# Patient Record
Sex: Female | Born: 1970 | Race: White | Hispanic: No | Marital: Married | Smoking: Never smoker
Health system: Southern US, Community
[De-identification: ages and names within clinical notes are randomized; demographics above are authoritative.]

## PROBLEM LIST (undated history)

## (undated) DIAGNOSIS — M1990 Unspecified osteoarthritis, unspecified site: Secondary | ICD-10-CM

## (undated) DIAGNOSIS — Z9889 Other specified postprocedural states: Secondary | ICD-10-CM

## (undated) DIAGNOSIS — F419 Anxiety disorder, unspecified: Secondary | ICD-10-CM

## (undated) DIAGNOSIS — R112 Nausea with vomiting, unspecified: Secondary | ICD-10-CM

## (undated) DIAGNOSIS — E785 Hyperlipidemia, unspecified: Secondary | ICD-10-CM

## (undated) DIAGNOSIS — R748 Abnormal levels of other serum enzymes: Secondary | ICD-10-CM

## (undated) DIAGNOSIS — K760 Fatty (change of) liver, not elsewhere classified: Secondary | ICD-10-CM

## (undated) DIAGNOSIS — H60543 Acute eczematoid otitis externa, bilateral: Secondary | ICD-10-CM

## (undated) DIAGNOSIS — G43909 Migraine, unspecified, not intractable, without status migrainosus: Secondary | ICD-10-CM

## (undated) DIAGNOSIS — M25569 Pain in unspecified knee: Secondary | ICD-10-CM

## (undated) DIAGNOSIS — J302 Other seasonal allergic rhinitis: Secondary | ICD-10-CM

## (undated) DIAGNOSIS — T4145XA Adverse effect of unspecified anesthetic, initial encounter: Secondary | ICD-10-CM

## (undated) DIAGNOSIS — R0600 Dyspnea, unspecified: Secondary | ICD-10-CM

## (undated) DIAGNOSIS — D649 Anemia, unspecified: Secondary | ICD-10-CM

## (undated) DIAGNOSIS — T8859XA Other complications of anesthesia, initial encounter: Secondary | ICD-10-CM

## (undated) HISTORY — PX: BLADDER REPAIR: SHX76

## (undated) HISTORY — PX: ABDOMINAL HYSTERECTOMY: SHX81

---
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
## 2008-04-07 ENCOUNTER — Encounter: Admission: RE | Admit: 2008-04-07 | Discharge: 2008-04-07 | Payer: Self-pay | Admitting: Neurology

## 2009-01-27 ENCOUNTER — Encounter: Admission: RE | Admit: 2009-01-27 | Discharge: 2009-01-27 | Payer: Self-pay | Admitting: Geriatric Medicine

## 2009-02-16 ENCOUNTER — Ambulatory Visit: Payer: Self-pay | Admitting: Family Medicine

## 2009-07-25 ENCOUNTER — Ambulatory Visit: Payer: Self-pay | Admitting: Obstetrics and Gynecology

## 2009-07-28 ENCOUNTER — Ambulatory Visit: Payer: Self-pay | Admitting: Obstetrics and Gynecology

## 2011-07-23 ENCOUNTER — Ambulatory Visit: Payer: Self-pay | Admitting: Family Medicine

## 2011-10-02 ENCOUNTER — Ambulatory Visit: Payer: Self-pay | Admitting: Obstetrics and Gynecology

## 2011-10-09 ENCOUNTER — Ambulatory Visit: Payer: Self-pay | Admitting: Obstetrics and Gynecology

## 2011-10-10 LAB — PATHOLOGY REPORT

## 2011-10-13 ENCOUNTER — Inpatient Hospital Stay: Payer: Self-pay | Admitting: Obstetrics and Gynecology

## 2011-10-14 ENCOUNTER — Ambulatory Visit: Payer: Self-pay | Admitting: Urology

## 2013-11-17 ENCOUNTER — Ambulatory Visit: Payer: Self-pay | Admitting: Family Medicine

## 2014-11-18 ENCOUNTER — Ambulatory Visit: Payer: Self-pay | Admitting: Family Medicine

## 2015-03-27 NOTE — Op Note (Signed)
PATIENT NAME:  Felicia Ellis, Felicia Ellis MR#:  [id number] DATE OF BIRTH:  1971/01/13  DATE OF PROCEDURE:  10/14/2011  PREOPERATIVE DIAGNOSIS: Bladder perforation.  POSTOPERATIVE DIAGNOSIS: Bladder perforation.  PROCEDURE PERFORMED: Pelvic exploration with repair of cystotomy (transvesical).  SURGEON: Darcella Cheshire, MD  ASSISTANT: Dia Crawford, MD  ANESTHESIA:  General.  ESTIMATED BLOOD LOSS: 200 mL.  COMPLICATIONS: None.  DRAINS:  1. 36 French Foley catheter to straight drainage. 2. 19 French Blake drain in the right pelvis to bulb suction.  PATHOLOGY/SPECIMENS: None.  DESCRIPTION OF PROCEDURE: The patient was brought to the operating room and placed on the operating room table in the supine position.  After general anesthesia was established, the patient was placed in a low lithotomy position and prepped and draped in the usual sterile fashion.  It should be noted that the previously placed Foley catheter was removed prior to prepping and draping the patient.  A 22 French Foley catheter was then placed sterilely per urethra and the balloon inflated with 10 mL of sterile water.    A Pfannenstiel incision was then made through the patient's prior Pfannenstiel incision and carried down through the subcutaneous tissues to the anterior rectus fascia. This was divided transversely the length of the wound.  Superiorly the rectus fascia was then developed for approximately 10 cm.  The rectus musculature was then separated in the midline and the retropubic and bilateral pelvic spaces developed bluntly.   The peritoneum was then entered sharply by elevating the peritoneum with two DeBakey pickups.  The peritoneum was then opened cephalad for a distance of approximately 10 cm.  The bowel was then gently packed away cephalad exposing the dome of the bladder along with the posterior wall of the bladder and the vaginal cuff.  At this point, the Bookwalter retraction system was then established to maintain excellent  exposure throughout the remainder of the case.  Great care was taken to ensure that all retractor blades were well padded and did not produce any neurovascular compression.    A 2 cm vertical defect was appreciated in the right aspect of the posterior bladder wall just above the level of the vaginal cuff.  Given its close proximity to the bladder cuff, it was opted to perform a transvesical approach to the defect and so the dome of the bladder was then grasped between two Babcock clamps and a vertical incision was then made with the cutting current of the cautery from the dome down the mid anterior bladder wall.  A stay suture of 2-0 Vicryl was then placed at the apex of the anterior cystotomy.    Careful inspection of the interior of the bladder revealed the ureteral orifices bilaterally to be intact and to be effluxing clear urine freely.  The defect in the posterior right bladder wall was well away from the trigone and ureteral orifices.  The defect was then closed in two layers utilizing 2-0 Vicryl.  The first layer incorporated the full thickness of the serosa and the mucosa in a simple running fashion locking every other throw. The second layer was an imbricating running layer of 2-0 Vicryl incorporating the serosa and the muscularis of the bladder in a Lembert fashion. After the defect was closed in two layers, the anterior cystostomy was then closed in two layers as well, again with a running full thickness layer incorporating the muscularis and the mucosa in a simple running fashion. A second imbricating layer of the serosa and muscularis was then performed, again  with 2-0 Vicryl suture.   An Indigo carmine solution of 500 mL of sterile saline and 1 ampule of Indigo carmine was instilled gently via the Foley catheter. The bladder was distended to 200 mL with a water-tight closure of the cystotomy and the right posterior wall and the anterior cystotomy confirmed. As such, the catheter was placed to  straight drainage and a 19 [place] Blake drain then passed through a separate stab incision in the right lower quadrant, in the superior aspect of the Pfannenstiel incision, and was secured in place with a 3-0 nylon suture.  The Blake drain was placed in the right pelvis to drain the repair of the cystotomy.  The omentum was also brought down and was able to reach the pelvis in the area of the repair without tension.    The rectus fascia was then closed with simple interrupted sutures of 0 Maxon.  The subcutaneous tissues were then irrigated and the skin reapproximated with staples. The wound was then cleaned with a sterile saline gauze and then dried followed by Xeroform and gauze dressing held in place with a sterile Op-Site dressing.   The patient was then taken out of the lithotomy position, extubated, and transferred to the post-anesthesia care unit in stable condition having tolerated the procedure well.  ____________________________ Darcella Cheshire, MD jhk:slb D: 10/14/2011 01:46:24 ET     T: 10/14/2011 12:10:00 ET         JOB#: [id number] Aurelio Jew Kiearra Oyervides MD ELECTRONICALLY SIGNED 01/18/2012 15:53

## 2015-03-27 NOTE — Consult Note (Signed)
PATIENT NAME:  Felicia Ellis, Felicia Ellis MR#:  [id number] DATE OF BIRTH:  [birth year]  DATE OF CONSULTATION:  10/13/2011  REFERRING PHYSICIAN:  Will Bonnet, MD CONSULTING PHYSICIAN:  Darcella Cheshire, MD  REASON FOR CONSULTATION: Bladder perforation.  HISTORY OF PRESENT ILLNESS: The patient is a 44 year old white female postoperative day number four from a laparoscopic hysterectomy by Dr. Donzetta Matters.  The patient was discharged on postoperative day number one.  She was doing well until yesterday morning when she felt a sense of urgency and voided a small amount with a mild degree of dysuria. The patient was unable to void since that episode and throughout the course of the day today developed progressive lower abdominal discomfort with nausea.  The patient contacted gynecology on-call, Dr. Glennon Mac, and was advised to present to the emergency department where a CT scan was performed which was consistent with a bladder perforation of the posterior wall with leakage of urine into the pelvis.  There was a moderate amount of free fluid in the pelvis present as well.  White blood cell count in the emergency department revealed a leukocytosis of 14.8 thousand with a hematocrit of 38.6.  The serum creatinine was elevated at 2.99.  Urinalysis revealed greater than 30 red blood cells per high-power field with 5 to 15 white blood cells and 2+ bacteria. The patient was admitted to the gynecology service and urology consultation requested.    PAST MEDICAL HISTORY: Migraines.  PAST SURGICAL HISTORY: As per the History of Present Illness plus: 1. Previous cesarean section. 2. Dilation and curettage. 3. Laparoscopic bilateral tubal ligation. 4. Hysteroscopy and polypectomy.   DRUG ALLERGIES: The patient reports a sensitivity to codeine, which causes nausea.   MEDICATIONS: Norco as needed for pain.  SOCIAL HISTORY: Noncontributory.  FAMILY HISTORY: Noncontributory.  REVIEW OF SYSTEMS: As per the History of  Present Illness, otherwise negative.   PHYSICAL EXAMINATION:  GENERAL: Well-developed, well-nourished white female in moderate discomfort. She is awake, alert, and oriented x4, appropriate and nonfocal.  VITALS: Aside from a mild tachycardia at 110, the patient's vital signs are within normal limits.   HEENT: Normocephalic, atraumatic cranium with extraocular movements are intact and anicteric sclerae.  NECK: No masses and no bruits.  CHEST: Lungs are clear to auscultation with normal respiratory effort.  HEART: Mild tachycardia with a regular rhythm without murmurs, gallops or rubs.  ABDOMEN: Diffusely tender to palpation and percussion, nondistended, and without bowel sounds.   LOWER EXTREMITIES: No edema or cords.  SKIN: No lesions.  LYMPH: No cervical or supraclavicular adenopathy.  IMPRESSION:  Bladder perforation with pelvic and intraperitoneal urine leak.  RECOMMENDATIONS: Urgent laparotomy with pelvic exploration and repair of cystotomy. This was discussed at length with Dr. Glennon Mac as well as the patient and her husband.  ____________________________ Darcella Cheshire, MD jhk:slb D: 10/13/2011 20:41:07 ET     T: 10/14/2011 11:20:09 ET        JOB#: [id number] Aurelio Jew Boss Danielsen MD ELECTRONICALLY SIGNED 01/18/2012 15:49

## 2015-08-12 ENCOUNTER — Other Ambulatory Visit: Payer: Self-pay | Admitting: Neurology

## 2015-08-12 DIAGNOSIS — G43719 Chronic migraine without aura, intractable, without status migrainosus: Secondary | ICD-10-CM

## 2015-09-05 ENCOUNTER — Ambulatory Visit
Admission: RE | Admit: 2015-09-05 | Discharge: 2015-09-05 | Disposition: A | Discharge status: Home / Self Care | Payer: 59 | Source: Ambulatory Visit | Attending: Neurology | Admitting: Neurology

## 2015-09-05 DIAGNOSIS — G43719 Chronic migraine without aura, intractable, without status migrainosus: Secondary | ICD-10-CM | POA: Diagnosis not present

## 2015-09-05 MED ORDER — GADOBENATE DIMEGLUMINE 529 MG/ML IV SOLN
15.0000 mL | Freq: Once | INTRAVENOUS | Status: AC | PRN
  Administered 2015-09-05: 15 mL via INTRAVENOUS

## 2015-10-11 ENCOUNTER — Other Ambulatory Visit: Payer: Self-pay | Admitting: Family Medicine

## 2015-10-11 DIAGNOSIS — Z1231 Encounter for screening mammogram for malignant neoplasm of breast: Secondary | ICD-10-CM

## 2015-11-21 ENCOUNTER — Ambulatory Visit
Admission: RE | Admit: 2015-11-21 | Discharge: 2015-11-21 | Disposition: A | Discharge status: Home / Self Care | Payer: 59 | Source: Ambulatory Visit | Attending: Family Medicine | Admitting: Family Medicine

## 2015-11-21 DIAGNOSIS — Z1231 Encounter for screening mammogram for malignant neoplasm of breast: Secondary | ICD-10-CM | POA: Insufficient documentation

## 2015-11-25 ENCOUNTER — Other Ambulatory Visit: Payer: Self-pay | Admitting: Family Medicine

## 2015-11-25 DIAGNOSIS — N6489 Other specified disorders of breast: Secondary | ICD-10-CM

## 2015-12-07 ENCOUNTER — Ambulatory Visit
Admission: RE | Admit: 2015-12-07 | Discharge: 2015-12-07 | Disposition: A | Discharge status: Home / Self Care | Payer: 59 | Source: Ambulatory Visit | Attending: Family Medicine | Admitting: Family Medicine

## 2015-12-07 DIAGNOSIS — N6489 Other specified disorders of breast: Secondary | ICD-10-CM

## 2016-05-01 ENCOUNTER — Other Ambulatory Visit: Payer: Self-pay | Admitting: Family Medicine

## 2016-05-01 DIAGNOSIS — N6489 Other specified disorders of breast: Secondary | ICD-10-CM

## 2016-06-07 ENCOUNTER — Ambulatory Visit
Admission: RE | Admit: 2016-06-07 | Discharge: 2016-06-07 | Disposition: A | Discharge status: Home / Self Care | Payer: 59 | Source: Ambulatory Visit | Attending: Family Medicine | Admitting: Family Medicine

## 2016-06-07 DIAGNOSIS — N6489 Other specified disorders of breast: Secondary | ICD-10-CM | POA: Diagnosis present

## 2016-06-07 DIAGNOSIS — N6002 Solitary cyst of left breast: Secondary | ICD-10-CM | POA: Diagnosis not present

## 2016-12-14 ENCOUNTER — Other Ambulatory Visit: Payer: Self-pay | Admitting: Family Medicine

## 2016-12-14 DIAGNOSIS — N6489 Other specified disorders of breast: Secondary | ICD-10-CM

## 2017-01-22 ENCOUNTER — Ambulatory Visit
Admission: RE | Admit: 2017-01-22 | Discharge: 2017-01-22 | Disposition: A | Discharge status: Home / Self Care | Payer: 59 | Source: Ambulatory Visit | Attending: Family Medicine | Admitting: Family Medicine

## 2017-01-22 DIAGNOSIS — Z0000 Encounter for general adult medical examination without abnormal findings: Secondary | ICD-10-CM | POA: Diagnosis not present

## 2017-01-22 DIAGNOSIS — N6489 Other specified disorders of breast: Secondary | ICD-10-CM | POA: Diagnosis present

## 2017-06-19 ENCOUNTER — Other Ambulatory Visit: Payer: Self-pay | Admitting: Family Medicine

## 2017-06-19 DIAGNOSIS — R7989 Other specified abnormal findings of blood chemistry: Secondary | ICD-10-CM

## 2017-06-19 DIAGNOSIS — R945 Abnormal results of liver function studies: Principal | ICD-10-CM

## 2017-06-25 ENCOUNTER — Ambulatory Visit
Admission: RE | Admit: 2017-06-25 | Discharge: 2017-06-25 | Disposition: A | Discharge status: Home / Self Care | Payer: 59 | Source: Ambulatory Visit | Attending: Family Medicine | Admitting: Family Medicine

## 2017-06-25 DIAGNOSIS — K760 Fatty (change of) liver, not elsewhere classified: Secondary | ICD-10-CM | POA: Diagnosis not present

## 2017-06-25 DIAGNOSIS — R5381 Other malaise: Secondary | ICD-10-CM | POA: Insufficient documentation

## 2017-06-25 DIAGNOSIS — R7989 Other specified abnormal findings of blood chemistry: Secondary | ICD-10-CM | POA: Insufficient documentation

## 2017-06-25 DIAGNOSIS — R5383 Other fatigue: Secondary | ICD-10-CM | POA: Insufficient documentation

## 2017-06-25 DIAGNOSIS — R945 Abnormal results of liver function studies: Secondary | ICD-10-CM

## 2018-03-16 ENCOUNTER — Encounter: Payer: Self-pay | Admitting: Emergency Medicine

## 2018-03-16 ENCOUNTER — Emergency Department
Admission: EM | Admit: 2018-03-16 | Discharge: 2018-03-16 | Disposition: A | Discharge status: Home / Self Care | Payer: Managed Care, Other (non HMO) | Attending: Emergency Medicine | Admitting: Emergency Medicine

## 2018-03-16 DIAGNOSIS — G43909 Migraine, unspecified, not intractable, without status migrainosus: Secondary | ICD-10-CM | POA: Insufficient documentation

## 2018-03-16 DIAGNOSIS — R51 Headache: Secondary | ICD-10-CM | POA: Diagnosis present

## 2018-03-16 HISTORY — DX: Migraine, unspecified, not intractable, without status migrainosus: G43.909

## 2018-03-16 MED ORDER — METOCLOPRAMIDE HCL 5 MG/ML IJ SOLN
INTRAMUSCULAR | Status: AC
  Filled 2018-03-16: qty 2

## 2018-03-16 MED ORDER — PROMETHAZINE HCL 25 MG PO TABS: 25.0000 mg | ORAL_TABLET | ORAL | 1 refills | Status: DC | PRN

## 2018-03-16 MED ORDER — KETOROLAC TROMETHAMINE 30 MG/ML IJ SOLN
INTRAMUSCULAR | Status: AC
  Filled 2018-03-16: qty 1

## 2018-03-16 MED ORDER — DIPHENHYDRAMINE HCL 50 MG/ML IJ SOLN
INTRAMUSCULAR | Status: AC
  Filled 2018-03-16: qty 1

## 2018-03-16 MED ORDER — KETOROLAC TROMETHAMINE 30 MG/ML IJ SOLN
30.0000 mg | Freq: Once | INTRAMUSCULAR | Status: AC
  Administered 2018-03-16: 30 mg via INTRAVENOUS

## 2018-03-16 MED ORDER — DIPHENHYDRAMINE HCL 50 MG/ML IJ SOLN
25.0000 mg | Freq: Once | INTRAMUSCULAR | Status: AC
  Administered 2018-03-16: 25 mg via INTRAVENOUS

## 2018-03-16 MED ORDER — METOCLOPRAMIDE HCL 5 MG/ML IJ SOLN
10.0000 mg | Freq: Once | INTRAMUSCULAR | Status: AC
  Administered 2018-03-16: 10 mg via INTRAVENOUS

## 2018-03-16 MED ORDER — SODIUM CHLORIDE 0.9 % IV SOLN
Freq: Once | INTRAVENOUS | Status: AC
  Administered 2018-03-16: 20:00:00 via INTRAVENOUS

## 2018-03-16 NOTE — ED Notes (Signed)
Pt has a headache since 530 am today.  Pt reports taking rx meds without relief of pain.  States injection of toradol and nausea meds usually work for her migraine h/a's  Pt alert   Speech clear.

## 2018-03-16 NOTE — ED Triage Notes (Signed)
Pt comes into the ED via POV c/o migraine.  Patient has h/o migraines and states this feels the same.  Patient neurologically intact at this time.  Patient has nausea and photosensitivity.  Patient in NAD with even and unlabored respirations.

## 2018-03-16 NOTE — ED Provider Notes (Signed)
[facility] Emergency Department Provider Note       Time seen: ----------------------------------------- 8:00 PM on 03/16/2018 -----------------------------------------   I have reviewed the triage vital signs and the nursing notes.  HISTORY   Chief Complaint Migraine    HPI Felicia Ellis is a 47 y.o. female with a history of migraines who presents to the ED for headache.  Patient states headache began at 530 this morning today.  She typically takes etodolac for her headaches but states she has not been able to keep anything down.  Patient feels like the barometric pressure may have caused her worsening headache today.  She denies fevers, chills or other complaints.  She has had nausea and photosensitivity.  Past Medical History:  Diagnosis Date  . Migraines     There are no active problems to display for this patient.   Past Surgical History:  Procedure Laterality Date  . ABDOMINAL HYSTERECTOMY    . BLADDER REPAIR    . CESAREAN SECTION      Allergies Morphine and related and Penicillins  Social History Social History   Tobacco Use  . Smoking status: Never Smoker  . Smokeless tobacco: Never Used  Substance Use Topics  . Alcohol use: Yes  . Drug use: Never   Review of Systems Constitutional: Negative for fever. Eyes: Negative for vision changes, positive for photosensitivity Cardiovascular: Negative for chest pain. Respiratory: Negative for shortness of breath. Gastrointestinal: Negative for abdominal pain, positive for nausea Musculoskeletal: Negative for back pain. Skin: Negative for rash. Neurological: Positive for headache  All systems negative/normal/unremarkable except as stated in the HPI  ____________________________________________   PHYSICAL EXAM:  VITAL SIGNS: ED Triage Vitals  Enc Vitals Group     BP 03/16/18 1942 (!) 128/51     Pulse Rate 03/16/18 1942 80     Resp 03/16/18 1942 18     Temp 03/16/18 1942 98.4  F (36.9 C)     Temp Source 03/16/18 1942 Oral     SpO2 03/16/18 1942 95 %     Weight 03/16/18 1938 198 lb (89.8 kg)     Height 03/16/18 1938 5\' 4"  (1.626 m)     Head Circumference --      Peak Flow --      Pain Score 03/16/18 1938 10     Pain Loc --      Pain Edu? --      Excl. in [place]? --    Constitutional: Alert and oriented.  Mild distress Eyes: Conjunctivae are normal. Normal extraocular movements.  Photophobia is noted ENT   Head: Normocephalic and atraumatic.   Nose: No congestion/rhinnorhea.   Mouth/Throat: Mucous membranes are moist.   Neck: No stridor. Cardiovascular: Normal rate, regular rhythm. No murmurs, rubs, or gallops. Respiratory: Normal respiratory effort without tachypnea nor retractions. Breath sounds are clear and equal bilaterally. No wheezes/rales/rhonchi. Gastrointestinal: Soft and nontender. Normal bowel sounds Musculoskeletal: Nontender with normal range of motion in extremities. No lower extremity tenderness nor edema. Neurologic:  Normal speech and language. No gross focal neurologic deficits are appreciated.  Skin:  Skin is warm, dry and intact. No rash noted. Psychiatric: Mood and affect are normal. Speech and behavior are normal.  ____________________________________________  ED COURSE:  As part of my medical decision making, I reviewed the following data within the [place] History obtained from family if available, nursing notes, old chart and ekg, as well as notes from prior ED visits. Patient presented for headache, patient  will receive IV headache cocktail we will reevaluate.   Procedures ____________________________________________  DIFFERENTIAL DIAGNOSIS   Migraine, tension headache, dehydration, sinusitis  FINAL ASSESSMENT AND PLAN  Migraine headache   Plan: The patient had presented for her typical migraine headache.  Patient was given Toradol, Reglan and Benadryl and states she feels much better.   She will be discharged with Phenergan to take as needed.   Laurence Aly, MD   Note: This note was generated in part or whole with voice recognition software. Voice recognition is usually quite accurate but there are transcription errors that can and very often do occur. I apologize for any typographical errors that were not detected and corrected.     Earleen Newport, MD 03/16/18 2152

## 2018-05-03 ENCOUNTER — Encounter (HOSPITAL_COMMUNITY): Payer: Self-pay | Admitting: Emergency Medicine

## 2018-05-03 ENCOUNTER — Emergency Department (HOSPITAL_COMMUNITY)
Admission: EM | Admit: 2018-05-03 | Discharge: 2018-05-04 | Disposition: A | Discharge status: Home / Self Care | Payer: Managed Care, Other (non HMO) | Attending: Emergency Medicine | Admitting: Emergency Medicine

## 2018-05-03 ENCOUNTER — Other Ambulatory Visit: Payer: Self-pay

## 2018-05-03 ENCOUNTER — Emergency Department (HOSPITAL_COMMUNITY): Payer: Managed Care, Other (non HMO)

## 2018-05-03 DIAGNOSIS — M25562 Pain in left knee: Secondary | ICD-10-CM | POA: Diagnosis present

## 2018-05-03 DIAGNOSIS — Z5321 Procedure and treatment not carried out due to patient leaving prior to being seen by health care provider: Secondary | ICD-10-CM | POA: Insufficient documentation

## 2018-05-03 NOTE — ED Triage Notes (Signed)
Reports having a small knot to the back of the left knee.  Reports noticing increase in swelling and worsening pain for a week now.  Having difficulty ambulating.

## 2018-05-03 NOTE — ED Triage Notes (Addendum)
Documentation correct, charted in wrong chart.Marland Kitchen

## 2018-05-04 ENCOUNTER — Encounter: Payer: Self-pay | Admitting: Emergency Medicine

## 2018-05-04 ENCOUNTER — Other Ambulatory Visit: Payer: Self-pay

## 2018-05-04 ENCOUNTER — Emergency Department: Payer: Managed Care, Other (non HMO)

## 2018-05-04 ENCOUNTER — Emergency Department
Admission: EM | Admit: 2018-05-04 | Discharge: 2018-05-04 | Disposition: A | Discharge status: Home / Self Care | Payer: Managed Care, Other (non HMO) | Attending: Emergency Medicine | Admitting: Emergency Medicine

## 2018-05-04 DIAGNOSIS — M7989 Other specified soft tissue disorders: Secondary | ICD-10-CM | POA: Insufficient documentation

## 2018-05-04 DIAGNOSIS — M25562 Pain in left knee: Secondary | ICD-10-CM

## 2018-05-04 DIAGNOSIS — M7122 Synovial cyst of popliteal space [Baker], left knee: Secondary | ICD-10-CM | POA: Diagnosis not present

## 2018-05-04 HISTORY — DX: Anxiety disorder, unspecified: F41.9

## 2018-05-04 MED ORDER — PREDNISONE 10 MG (21) PO TBPK: ORAL_TABLET | ORAL | 0 refills | Status: DC

## 2018-05-04 MED ORDER — KETOROLAC TROMETHAMINE 60 MG/2ML IM SOLN
60.0000 mg | Freq: Once | INTRAMUSCULAR | Status: AC
  Administered 2018-05-04: 60 mg via INTRAMUSCULAR
  Filled 2018-05-04: qty 2

## 2018-05-04 NOTE — ED Notes (Signed)
Notified by triage of pt coming to room after finished in ultrasound.

## 2018-05-04 NOTE — ED Notes (Signed)
Patient transported to Ultrasound 

## 2018-05-04 NOTE — Discharge Instructions (Addendum)
Please take the medication to help with your pain.  Please return with any worsening condition, please follow-up with orthopedic surgery for further evaluation and treatment of your cyst.

## 2018-05-04 NOTE — ED Notes (Signed)
Pt stated they couldn't wait any longer, lwbs.

## 2018-05-04 NOTE — ED Provider Notes (Signed)
[facility] Emergency Department Provider Note   ____________________________________________   First MD Initiated Contact with Patient 05/04/18 [phone]     (approximate)  I have reviewed the triage vital signs and the nursing notes.   HISTORY  Chief Complaint Knee Pain    HPI Felicia Ellis is a 47 y.o. female who comes into the hospital today with some left knee pain and swelling.  The patient states that she has a spot behind her knee that was like a knot.  Its been there for about 6 months.  The patient states though that for the past week its been causing more pain.  She feels a lot of pressure and the swelling seems to have gone all the way down her leg into her foot.  The patient states that she has not taken any medications for pain and had not had it as evaluated before.  The patient is never had this before.  She rates her pain a 6 out of 10 in intensity currently.  She was at another hospital but left due to the weight.  She is here today for evaluation.   Past Medical History:  Diagnosis Date  . Anxiety   . Migraines     There are no active problems to display for this patient.   Past Surgical History:  Procedure Laterality Date  . ABDOMINAL HYSTERECTOMY    . BLADDER REPAIR    . CESAREAN SECTION      Prior to Admission medications   Medication Sig Start Date End Date Taking? Authorizing Provider  promethazine (PHENERGAN) 25 MG tablet Take 1 tablet (25 mg total) by mouth every 4 (four) hours as needed for nausea or vomiting. 03/16/18  Yes Earleen Newport, MD  predniSONE (STERAPRED UNI-PAK 21 TAB) 10 MG (21) TBPK tablet Take 6 tabs on day 1 Take 5 tabs on day 2 Take 4 tabs on day 3 Take 3 tabs on day 4 Take 2 tabs on day 5 Take 1 tab on day 6 05/04/18   Loney Hering, MD    Allergies Metronidazole; Other; Rizatriptan; Sumatriptan succinate; Codeine; Morphine and related; and Penicillins  Family History  Problem Relation Age  of Onset  . Breast cancer Neg Hx     Social History Social History   Tobacco Use  . Smoking status: Never Smoker  . Smokeless tobacco: Never Used  Substance Use Topics  . Alcohol use: Yes  . Drug use: Never    Review of Systems  Constitutional: No fever/chills Eyes: No visual changes. ENT: No sore throat. Cardiovascular: Denies chest pain. Respiratory: Denies shortness of breath. Gastrointestinal: No abdominal pain.  No nausea, no vomiting.  No diarrhea.  No constipation. Genitourinary: Negative for dysuria. Musculoskeletal: Pain behind left knee Skin: Negative for rash. Neurological: Negative for headaches, focal weakness or numbness.   ____________________________________________   PHYSICAL EXAM:  VITAL SIGNS: ED Triage Vitals [05/04/18 0233]  Enc Vitals Group     BP 127/69     Pulse Rate 67     Resp 16     Temp 98.7 F (37.1 C)     Temp Source Oral     SpO2 99 %     Weight 198 lb (89.8 kg)     Height 5' 4.5" (1.638 m)     Head Circumference      Peak Flow      Pain Score 6     Pain Loc      Pain Edu?  Excl. in [place]?     Constitutional: Alert and oriented. Well appearing and in mild distress. Eyes: Conjunctivae are normal. PERRL. EOMI. Head: Atraumatic. Nose: No congestion/rhinnorhea. Mouth/Throat: Mucous membranes are moist.  Oropharynx non-erythematous. Cardiovascular: Normal rate, regular rhythm. Grossly normal heart sounds.  Good peripheral circulation. Respiratory: Normal respiratory effort.  No retractions. Lungs CTAB. Gastrointestinal: Soft and nontender. No distention.  Positive bowel sounds Musculoskeletal: Firm soft tissue swelling palpated behind the left knee with no tenderness palpation of the calf, mild swelling of the left lower extremity and foot Neurologic:  Normal speech and language.  Skin:  Skin is warm, dry and intact.  Psychiatric: Mood and affect are normal.   ____________________________________________   LABS (all labs  ordered are listed, but only abnormal results are displayed)  Labs Reviewed - No data to display ____________________________________________  EKG  none ____________________________________________  RADIOLOGY  ED MD interpretation:  US venous doppler: No evidence of deep venous thrombosis  Official radiology report(s): US Venous Img Lower Unilateral Left  Result Date: 05/04/2018 CLINICAL DATA:  Acute onset of left knee pain. EXAM: LEFT LOWER EXTREMITY VENOUS DOPPLER ULTRASOUND TECHNIQUE: Gray-scale sonography with graded compression, as well as color Doppler and duplex ultrasound were performed to evaluate the lower extremity deep venous systems from the level of the common femoral vein and including the common femoral, femoral, profunda femoral, popliteal and calf veins including the posterior tibial, peroneal and gastrocnemius veins when visible. The superficial great saphenous vein was also interrogated. Spectral Doppler was utilized to evaluate flow at rest and with distal augmentation maneuvers in the common femoral, femoral and popliteal veins. COMPARISON:  None. FINDINGS: Contralateral Common Femoral Vein: Respiratory phasicity is normal and symmetric with the symptomatic side. No evidence of thrombus. Normal compressibility. Common Femoral Vein: No evidence of thrombus. Normal compressibility, respiratory phasicity and response to augmentation. Saphenofemoral Junction: No evidence of thrombus. Normal compressibility and flow on color Doppler imaging. Profunda Femoral Vein: No evidence of thrombus. Normal compressibility and flow on color Doppler imaging. Femoral Vein: No evidence of thrombus. Normal compressibility, respiratory phasicity and response to augmentation. Popliteal Vein: No evidence of thrombus. Normal compressibility, respiratory phasicity and response to augmentation. Calf Veins: No evidence of thrombus. Normal compressibility and flow on color Doppler imaging. Superficial Great  Saphenous Vein: No evidence of thrombus. Normal compressibility. Venous Reflux:  None. Other Findings:  None. IMPRESSION: No evidence of deep venous thrombosis. Electronically Signed   By: Garald Balding M.D.   On: 05/04/2018 03:50   Dg Knee Complete 4 Views Left  Result Date: 05/03/2018 CLINICAL DATA:  Acute onset of left knee pain knot, with swelling and pain. Initial encounter. EXAM: LEFT KNEE - COMPLETE 4+ VIEW COMPARISON:  None. FINDINGS: There is no evidence of fracture or dislocation. The joint spaces are preserved. No significant degenerative change is seen; the patellofemoral joint is grossly unremarkable in appearance. A fabella is noted. No significant joint effusion is seen. The visualized soft tissues are normal in appearance. IMPRESSION: No evidence of fracture or dislocation. Electronically Signed   By: Garald Balding M.D.   On: 05/03/2018 22:46    ____________________________________________   PROCEDURES  Procedure(s) performed: None  Procedures  Critical Care performed: No  ____________________________________________   INITIAL IMPRESSION / ASSESSMENT AND PLAN / ED COURSE  As part of my medical decision making, I reviewed the following data within the electronic MEDICAL RECORD NUMBER Notes from prior ED visits and [place] Controlled Substance Database   This is a 47 year old female  who comes into the hospital today with a knot behind her left knee and some swelling and pain down her leg.  I did give the patient a shot of Toradol.  The patient had a knee x-ray done at [facility] which was unremarkable and then we did an ultrasound which did not show any DVT.    after my initial conversation with the patient before I did a physical exam the patient states that she did not feel comfortable being in the hallway.  I did explain to her that I did not have any beds available at the time but if she was uncomfortable we could wait for a bed to become available so that we could put her in  her room instead of having her in the hallway.  The patient states that it was okay for me to examine her but again was upset.  I reiterated waiting so that the patient can be evaluated in the privacy of her room and the patient said that she wanted to just get examined and get it over with to go home.  I feel that the patient may have a Baker's cyst causing some of her symptoms.  She is able to move her leg and her pulses are intact.  We will place the patient on crutches and I did give her a shot of Toradol.  She will be discharged with some steroids and encouraged to follow-up with orthopedic surgery.      ____________________________________________   FINAL CLINICAL IMPRESSION(S) / ED DIAGNOSES  Final diagnoses:  Acute pain of left knee  Synovial cyst of left popliteal space  Leg swelling     ED Discharge Orders        Ordered    predniSONE (STERAPRED UNI-PAK 21 TAB) 10 MG (21) TBPK tablet     05/04/18 0400       Note:  This document was prepared using Dragon voice recognition software and may include unintentional dictation errors.    Loney Hering, MD 05/04/18 [phone]

## 2018-05-04 NOTE — ED Triage Notes (Addendum)
Pt says she noticed a knot behind her left knee several months ago; pt says over night the area swelled up; tonight, leaving dinner, the pain became worse; unable to bear weight on leg; pt says she was in the waiting room at [facility] and left after waiting 5 hours; did have an xray while waiting; pt says pain is pressure and throbbing but sharp when she steps down off something

## 2018-05-16 ENCOUNTER — Other Ambulatory Visit: Payer: Self-pay | Admitting: Student

## 2018-05-16 DIAGNOSIS — M25562 Pain in left knee: Secondary | ICD-10-CM

## 2018-05-19 ENCOUNTER — Other Ambulatory Visit: Payer: Self-pay | Admitting: Student

## 2018-05-19 DIAGNOSIS — M25562 Pain in left knee: Secondary | ICD-10-CM

## 2018-05-28 ENCOUNTER — Ambulatory Visit
Admission: RE | Admit: 2018-05-28 | Discharge: 2018-05-28 | Disposition: A | Discharge status: Home / Self Care | Payer: Managed Care, Other (non HMO) | Source: Ambulatory Visit | Attending: Student | Admitting: Student

## 2018-05-28 DIAGNOSIS — M25462 Effusion, left knee: Secondary | ICD-10-CM | POA: Insufficient documentation

## 2018-05-28 DIAGNOSIS — M25562 Pain in left knee: Secondary | ICD-10-CM | POA: Diagnosis not present

## 2018-05-28 DIAGNOSIS — M76892 Other specified enthesopathies of left lower limb, excluding foot: Secondary | ICD-10-CM | POA: Insufficient documentation

## 2018-09-08 ENCOUNTER — Other Ambulatory Visit: Payer: Self-pay | Admitting: Family Medicine

## 2018-09-08 DIAGNOSIS — Z87898 Personal history of other specified conditions: Secondary | ICD-10-CM

## 2018-10-02 ENCOUNTER — Ambulatory Visit
Admission: RE | Admit: 2018-10-02 | Discharge: 2018-10-02 | Disposition: A | Discharge status: Home / Self Care | Payer: Managed Care, Other (non HMO) | Source: Ambulatory Visit | Attending: Family Medicine | Admitting: Family Medicine

## 2018-10-02 DIAGNOSIS — Z87898 Personal history of other specified conditions: Secondary | ICD-10-CM

## 2019-06-03 ENCOUNTER — Other Ambulatory Visit: Payer: Self-pay

## 2019-06-03 ENCOUNTER — Encounter
Admission: RE | Admit: 2019-06-03 | Discharge: 2019-06-03 | Disposition: A | Discharge status: Home / Self Care | Payer: Managed Care, Other (non HMO) | Source: Ambulatory Visit | Attending: Surgery | Admitting: Surgery

## 2019-06-03 HISTORY — DX: Other specified postprocedural states: R11.2

## 2019-06-03 HISTORY — DX: Other specified postprocedural states: Z98.890

## 2019-06-03 HISTORY — DX: Dyspnea, unspecified: R06.00

## 2019-06-03 HISTORY — DX: Other complications of anesthesia, initial encounter: T88.59XA

## 2019-06-03 HISTORY — DX: Other seasonal allergic rhinitis: J30.2

## 2019-06-03 HISTORY — DX: Fatty (change of) liver, not elsewhere classified: K76.0

## 2019-06-03 NOTE — Patient Instructions (Signed)
Your procedure is scheduled on: 06/11/2019 Thurs Report to Same Day Surgery 2nd floor medical mall [facility] Entrance-take elevator on left to 2nd floor.  Check in with surgery information desk.) To find out your arrival time please call [phone] between 1PM - 3PM on 06/10/2019 Wed  Remember: Instructions that are not followed completely may result in serious medical risk, up to and including death, or upon the discretion of your surgeon and anesthesiologist your surgery may need to be rescheduled.    _x___ 1. Do not eat food after midnight the night before your procedure. You may drink clear liquids up to 2 hours before you are scheduled to arrive at the hospital for your procedure.  Do not drink clear liquids within 2 hours of your scheduled arrival to the hospital.  Clear liquids include  --Water or Apple juice without pulp  --Clear carbohydrate beverage such as ClearFast or Gatorade  --Black Coffee or Clear Tea (No milk, no creamers, do not add anything to                  the coffee or Tea Type 1 and type 2 diabetics should only drink water.   ____Ensure clear carbohydrate drink on the way to the hospital for bariatric patients  ____Ensure clear carbohydrate drink 3 hours before surgery for Dr Dwyane Luo patients if physician instructed.   No gum chewing or hard candies.     __x__ 2. No Alcohol for 24 hours before or after surgery.   __x__3. No Smoking or e-cigarettes for 24 prior to surgery.  Do not use any chewable tobacco products for at least 6 hour prior to surgery   ____  4. Bring all medications with you on the day of surgery if instructed.    __x__ 5. Notify your doctor if there is any change in your medical condition     (cold, fever, infections).    x___6. On the morning of surgery brush your teeth with toothpaste and water.  You may rinse your mouth with mouth wash if you wish.  Do not swallow any toothpaste or mouthwash.   Do not wear jewelry, make-up, hairpins,  clips or nail polish.  Do not wear lotions, powders, or perfumes. You may wear deodorant.  Do not shave 48 hours prior to surgery. Men may shave face and neck.  Do not bring valuables to the hospital.    [facility] is not responsible for any belongings or valuables.               Contacts, dentures or bridgework may not be worn into surgery.  Leave your suitcase in the car. After surgery it may be brought to your room.  For patients admitted to the hospital, discharge time is determined by your                       treatment team.  _  Patients discharged the day of surgery will not be allowed to drive home.  You will need someone to drive you home and stay with you the night of your procedure.    Please read over the following fact sheets that you were given:   [facility] Preparing for Surgery and or MRSA Information   _x___ Take anti-hypertensive listed below, cardiac, seizure, asthma,     anti-reflux and psychiatric medicines. These include:  1. None  2.  3.  4.  5.  6.  ____Fleets enema or Magnesium Citrate as directed.  _x___ Use CHG Soap or sage wipes as directed on instruction sheet   ____ Use inhalers on the day of surgery and bring to hospital day of surgery  ____ Stop Metformin and Janumet 2 days prior to surgery.    ____ Take 1/2 of usual insulin dose the night before surgery and none on the morning     surgery.   _x___ Follow recommendations from Cardiologist, Pulmonologist or PCP regarding          stopping Aspirin, Coumadin, Plavix ,Eliquis, Effient, or Pradaxa, and Pletal.  X____Stop Anti-inflammatories such as Advil, Aleve, Ibuprofen, Motrin, Naproxen, Naprosyn, Goodies powders or aspirin products. OK to take Tylenol and  Celebrex. Stop Lodine  until after surgery   _x___ Stop supplements until after surgery.  But may continue Vitamin D, Vitamin B,       and multivitamin.   ____ Bring C-Pap to the hospital.

## 2019-06-08 ENCOUNTER — Other Ambulatory Visit: Payer: Self-pay

## 2019-06-08 ENCOUNTER — Encounter
Admission: RE | Admit: 2019-06-08 | Discharge: 2019-06-08 | Disposition: A | Discharge status: Home / Self Care | Payer: Managed Care, Other (non HMO) | Source: Ambulatory Visit | Attending: Surgery | Admitting: Surgery

## 2019-06-08 ENCOUNTER — Other Ambulatory Visit: Payer: Managed Care, Other (non HMO)

## 2019-06-08 DIAGNOSIS — F419 Anxiety disorder, unspecified: Secondary | ICD-10-CM | POA: Diagnosis not present

## 2019-06-08 DIAGNOSIS — K760 Fatty (change of) liver, not elsewhere classified: Secondary | ICD-10-CM | POA: Diagnosis not present

## 2019-06-08 DIAGNOSIS — Z885 Allergy status to narcotic agent status: Secondary | ICD-10-CM | POA: Diagnosis not present

## 2019-06-08 DIAGNOSIS — Z90710 Acquired absence of both cervix and uterus: Secondary | ICD-10-CM | POA: Diagnosis not present

## 2019-06-08 DIAGNOSIS — Z820 Family history of epilepsy and other diseases of the nervous system: Secondary | ICD-10-CM | POA: Diagnosis not present

## 2019-06-08 DIAGNOSIS — Z1159 Encounter for screening for other viral diseases: Secondary | ICD-10-CM | POA: Diagnosis not present

## 2019-06-08 DIAGNOSIS — M6752 Plica syndrome, left knee: Secondary | ICD-10-CM | POA: Diagnosis not present

## 2019-06-08 DIAGNOSIS — G43909 Migraine, unspecified, not intractable, without status migrainosus: Secondary | ICD-10-CM | POA: Diagnosis not present

## 2019-06-08 DIAGNOSIS — Z880 Allergy status to penicillin: Secondary | ICD-10-CM | POA: Diagnosis not present

## 2019-06-08 DIAGNOSIS — M1712 Unilateral primary osteoarthritis, left knee: Secondary | ICD-10-CM | POA: Diagnosis present

## 2019-06-08 DIAGNOSIS — Z79899 Other long term (current) drug therapy: Secondary | ICD-10-CM | POA: Diagnosis not present

## 2019-06-08 DIAGNOSIS — Z888 Allergy status to other drugs, medicaments and biological substances status: Secondary | ICD-10-CM | POA: Diagnosis not present

## 2019-06-08 DIAGNOSIS — M222X2 Patellofemoral disorders, left knee: Secondary | ICD-10-CM | POA: Diagnosis not present

## 2019-06-08 DIAGNOSIS — R0602 Shortness of breath: Secondary | ICD-10-CM | POA: Diagnosis not present

## 2019-06-08 DIAGNOSIS — Z8249 Family history of ischemic heart disease and other diseases of the circulatory system: Secondary | ICD-10-CM | POA: Diagnosis not present

## 2019-06-08 DIAGNOSIS — Z01810 Encounter for preprocedural cardiovascular examination: Secondary | ICD-10-CM

## 2019-06-08 LAB — PROTIME-INR
INR: 1.1 (ref 0.8–1.2)
Prothrombin Time: 14.5 seconds (ref 11.4–15.2)

## 2019-06-08 LAB — APTT: aPTT: 35 seconds (ref 24–36)

## 2019-06-09 LAB — SARS CORONAVIRUS 2 (TAT 6-24 HRS): SARS Coronavirus 2: NEGATIVE

## 2019-06-11 ENCOUNTER — Encounter
Admission: RE | Disposition: A | Discharge status: Home / Self Care | Payer: Self-pay | Source: Home / Self Care | Attending: Surgery

## 2019-06-11 ENCOUNTER — Encounter: Payer: Self-pay | Admitting: Emergency Medicine

## 2019-06-11 ENCOUNTER — Ambulatory Visit: Payer: Managed Care, Other (non HMO) | Admitting: Anesthesiology

## 2019-06-11 ENCOUNTER — Other Ambulatory Visit: Payer: Self-pay

## 2019-06-11 ENCOUNTER — Ambulatory Visit
Admission: RE | Admit: 2019-06-11 | Discharge: 2019-06-11 | Disposition: A | Discharge status: Home / Self Care | Payer: Managed Care, Other (non HMO) | Attending: Surgery | Admitting: Surgery

## 2019-06-11 DIAGNOSIS — M222X2 Patellofemoral disorders, left knee: Secondary | ICD-10-CM | POA: Insufficient documentation

## 2019-06-11 DIAGNOSIS — R0602 Shortness of breath: Secondary | ICD-10-CM | POA: Insufficient documentation

## 2019-06-11 DIAGNOSIS — Z888 Allergy status to other drugs, medicaments and biological substances status: Secondary | ICD-10-CM | POA: Insufficient documentation

## 2019-06-11 DIAGNOSIS — Z885 Allergy status to narcotic agent status: Secondary | ICD-10-CM | POA: Insufficient documentation

## 2019-06-11 DIAGNOSIS — M1712 Unilateral primary osteoarthritis, left knee: Secondary | ICD-10-CM | POA: Insufficient documentation

## 2019-06-11 DIAGNOSIS — Z820 Family history of epilepsy and other diseases of the nervous system: Secondary | ICD-10-CM | POA: Insufficient documentation

## 2019-06-11 DIAGNOSIS — M6752 Plica syndrome, left knee: Secondary | ICD-10-CM | POA: Insufficient documentation

## 2019-06-11 DIAGNOSIS — K760 Fatty (change of) liver, not elsewhere classified: Secondary | ICD-10-CM | POA: Insufficient documentation

## 2019-06-11 DIAGNOSIS — Z8249 Family history of ischemic heart disease and other diseases of the circulatory system: Secondary | ICD-10-CM | POA: Insufficient documentation

## 2019-06-11 DIAGNOSIS — Z90710 Acquired absence of both cervix and uterus: Secondary | ICD-10-CM | POA: Insufficient documentation

## 2019-06-11 DIAGNOSIS — G43909 Migraine, unspecified, not intractable, without status migrainosus: Secondary | ICD-10-CM | POA: Insufficient documentation

## 2019-06-11 DIAGNOSIS — Z79899 Other long term (current) drug therapy: Secondary | ICD-10-CM | POA: Insufficient documentation

## 2019-06-11 DIAGNOSIS — Z880 Allergy status to penicillin: Secondary | ICD-10-CM | POA: Insufficient documentation

## 2019-06-11 DIAGNOSIS — Z1159 Encounter for screening for other viral diseases: Secondary | ICD-10-CM | POA: Insufficient documentation

## 2019-06-11 DIAGNOSIS — F419 Anxiety disorder, unspecified: Secondary | ICD-10-CM | POA: Insufficient documentation

## 2019-06-11 HISTORY — PX: KNEE ARTHROSCOPY WITH LATERAL RELEASE: SHX5649

## 2019-06-11 SURGERY — ARTHROSCOPY, KNEE, WITH LATERAL RETINACULUM RELEASE
Anesthesia: General | Laterality: Left

## 2019-06-11 MED ORDER — ACETAMINOPHEN 10 MG/ML IV SOLN
INTRAVENOUS | Status: DC | PRN
  Administered 2019-06-11: 1000 mg via INTRAVENOUS

## 2019-06-11 MED ORDER — METOCLOPRAMIDE HCL 10 MG PO TABS: 5.0000 mg | ORAL_TABLET | Freq: Three times a day (TID) | ORAL | Status: DC | PRN

## 2019-06-11 MED ORDER — KETOROLAC TROMETHAMINE 10 MG PO TABS: 10.0000 mg | ORAL_TABLET | Freq: Four times a day (QID) | ORAL | 0 refills | Status: DC

## 2019-06-11 MED ORDER — BUPIVACAINE-EPINEPHRINE (PF) 0.5% -1:200000 IJ SOLN
INTRAMUSCULAR | Status: AC
  Filled 2019-06-11: qty 30

## 2019-06-11 MED ORDER — POTASSIUM CHLORIDE IN NACL 20-0.9 MEQ/L-% IV SOLN
INTRAVENOUS | Status: DC
  Filled 2019-06-11 (×4): qty 1000

## 2019-06-11 MED ORDER — FAMOTIDINE 20 MG PO TABS
ORAL_TABLET | ORAL | Status: AC
  Administered 2019-06-11: 06:00:00 20 mg via ORAL
  Filled 2019-06-11: qty 1

## 2019-06-11 MED ORDER — CEFAZOLIN SODIUM-DEXTROSE 1-4 GM/50ML-% IV SOLN
INTRAVENOUS | Status: DC | PRN
  Administered 2019-06-11: 2 g via INTRAVENOUS

## 2019-06-11 MED ORDER — ONDANSETRON HCL 4 MG/2ML IJ SOLN
INTRAMUSCULAR | Status: DC | PRN
  Administered 2019-06-11 (×2): 4 mg via INTRAVENOUS

## 2019-06-11 MED ORDER — DEXMEDETOMIDINE HCL 200 MCG/2ML IV SOLN
INTRAVENOUS | Status: DC | PRN
  Administered 2019-06-11: 12 ug via INTRAVENOUS

## 2019-06-11 MED ORDER — PROMETHAZINE HCL 25 MG/ML IJ SOLN: 6.2500 mg | INTRAMUSCULAR | Status: DC | PRN

## 2019-06-11 MED ORDER — ACETAMINOPHEN 160 MG/5ML PO SOLN
325.0000 mg | ORAL | Status: DC | PRN
  Filled 2019-06-11: qty 20.3

## 2019-06-11 MED ORDER — KETAMINE HCL 10 MG/ML IJ SOLN
INTRAMUSCULAR | Status: DC | PRN
  Administered 2019-06-11: 20 mg via INTRAVENOUS
  Administered 2019-06-11: 30 mg via INTRAVENOUS
  Administered 2019-06-11: 50 mg via INTRAVENOUS

## 2019-06-11 MED ORDER — TRAMADOL HCL 50 MG PO TABS: 50.0000 mg | ORAL_TABLET | Freq: Four times a day (QID) | ORAL | 0 refills | Status: DC | PRN

## 2019-06-11 MED ORDER — DEXAMETHASONE SODIUM PHOSPHATE 10 MG/ML IJ SOLN
INTRAMUSCULAR | Status: DC | PRN
  Administered 2019-06-11: 10 mg via INTRAVENOUS

## 2019-06-11 MED ORDER — SCOPOLAMINE 1 MG/3DAYS TD PT72
1.0000 | MEDICATED_PATCH | TRANSDERMAL | Status: DC
  Administered 2019-06-11: 1.5 mg via TRANSDERMAL

## 2019-06-11 MED ORDER — FAMOTIDINE 20 MG PO TABS
20.0000 mg | ORAL_TABLET | Freq: Once | ORAL | Status: AC
  Administered 2019-06-11: 06:00:00 20 mg via ORAL

## 2019-06-11 MED ORDER — LIDOCAINE HCL (PF) 2 % IJ SOLN
INTRAMUSCULAR | Status: AC
  Filled 2019-06-11: qty 10

## 2019-06-11 MED ORDER — LACTATED RINGERS IV SOLN
INTRAVENOUS | Status: DC
  Administered 2019-06-11: 06:00:00 via INTRAVENOUS

## 2019-06-11 MED ORDER — ACETAMINOPHEN 325 MG PO TABS: 325.0000 mg | ORAL_TABLET | ORAL | Status: DC | PRN

## 2019-06-11 MED ORDER — SCOPOLAMINE 1 MG/3DAYS TD PT72
MEDICATED_PATCH | TRANSDERMAL | Status: AC
  Filled 2019-06-11: qty 1

## 2019-06-11 MED ORDER — KETOROLAC TROMETHAMINE 30 MG/ML IJ SOLN: 30.0000 mg | Freq: Once | INTRAMUSCULAR | Status: DC | PRN

## 2019-06-11 MED ORDER — CEFAZOLIN SODIUM 1 G IJ SOLR
INTRAMUSCULAR | Status: AC
  Filled 2019-06-11: qty 20

## 2019-06-11 MED ORDER — KETOROLAC TROMETHAMINE 30 MG/ML IJ SOLN
30.0000 mg | Freq: Once | INTRAMUSCULAR | Status: AC
  Administered 2019-06-11: 30 mg via INTRAVENOUS
  Filled 2019-06-11 (×2): qty 1

## 2019-06-11 MED ORDER — DEXMEDETOMIDINE HCL IN NACL 80 MCG/20ML IV SOLN
INTRAVENOUS | Status: AC
  Filled 2019-06-11: qty 20

## 2019-06-11 MED ORDER — FENTANYL CITRATE (PF) 100 MCG/2ML IJ SOLN: 25.0000 ug | INTRAMUSCULAR | Status: DC | PRN

## 2019-06-11 MED ORDER — ONDANSETRON HCL 4 MG PO TABS: 4.0000 mg | ORAL_TABLET | Freq: Four times a day (QID) | ORAL | Status: DC | PRN

## 2019-06-11 MED ORDER — METOCLOPRAMIDE HCL 5 MG/ML IJ SOLN: 5.0000 mg | Freq: Three times a day (TID) | INTRAMUSCULAR | Status: DC | PRN

## 2019-06-11 MED ORDER — LIDOCAINE HCL (PF) 1 % IJ SOLN
INTRAMUSCULAR | Status: AC
  Filled 2019-06-11: qty 30

## 2019-06-11 MED ORDER — LIDOCAINE HCL 1 % IJ SOLN
INTRAMUSCULAR | Status: DC | PRN
  Administered 2019-06-11: 30 mL

## 2019-06-11 MED ORDER — PROPOFOL 10 MG/ML IV BOLUS
INTRAVENOUS | Status: AC
  Filled 2019-06-11: qty 20

## 2019-06-11 MED ORDER — PROPOFOL 500 MG/50ML IV EMUL
INTRAVENOUS | Status: AC
  Filled 2019-06-11: qty 50

## 2019-06-11 MED ORDER — DEXAMETHASONE SODIUM PHOSPHATE 10 MG/ML IJ SOLN
INTRAMUSCULAR | Status: AC
  Filled 2019-06-11: qty 1

## 2019-06-11 MED ORDER — MIDAZOLAM HCL 2 MG/2ML IJ SOLN
INTRAMUSCULAR | Status: AC
  Filled 2019-06-11: qty 2

## 2019-06-11 MED ORDER — KETAMINE HCL 50 MG/ML IJ SOLN
INTRAMUSCULAR | Status: AC
  Filled 2019-06-11: qty 10

## 2019-06-11 MED ORDER — BUPIVACAINE-EPINEPHRINE (PF) 0.5% -1:200000 IJ SOLN
INTRAMUSCULAR | Status: DC | PRN
  Administered 2019-06-11: 60 mL

## 2019-06-11 MED ORDER — ONDANSETRON HCL 4 MG/2ML IJ SOLN: 4.0000 mg | Freq: Four times a day (QID) | INTRAMUSCULAR | Status: DC | PRN

## 2019-06-11 MED ORDER — ACETAMINOPHEN 10 MG/ML IV SOLN
INTRAVENOUS | Status: AC
  Filled 2019-06-11: qty 100

## 2019-06-11 SURGICAL SUPPLY — 38 items
BAG COUNTER SPONGE EZ (MISCELLANEOUS) IMPLANT
BANDAGE ACE 6X5 VEL STRL LF (GAUZE/BANDAGES/DRESSINGS) ×3 IMPLANT
BLADE FULL RADIUS 3.5 (BLADE) ×3 IMPLANT
BLADE SHAVER 4.5X7 STR FR (MISCELLANEOUS) ×3 IMPLANT
CHLORAPREP W/TINT 26 (MISCELLANEOUS) ×3 IMPLANT
COUNTER SPONGE BAG EZ (MISCELLANEOUS)
COVER WAND RF STERILE (DRAPES) ×3 IMPLANT
CUFF TOURN SGL QUICK 24 (TOURNIQUET CUFF)
CUFF TOURN SGL QUICK 30 (TOURNIQUET CUFF)
CUFF TRNQT CYL 24X4X16.5-23 (TOURNIQUET CUFF) IMPLANT
CUFF TRNQT CYL 30X4X21-28X (TOURNIQUET CUFF) IMPLANT
DRAPE IMP U-DRAPE 54X76 (DRAPES) ×3 IMPLANT
ELECT REM PT RETURN 9FT ADLT (ELECTROSURGICAL) ×3
ELECTRODE REM PT RTRN 9FT ADLT (ELECTROSURGICAL) ×1 IMPLANT
GAUZE SPONGE 4X4 12PLY STRL (GAUZE/BANDAGES/DRESSINGS) ×3 IMPLANT
GLOVE BIO SURGEON STRL SZ8 (GLOVE) ×6 IMPLANT
GLOVE BIOGEL M 7.0 STRL (GLOVE) ×6 IMPLANT
GLOVE BIOGEL PI IND STRL 7.5 (GLOVE) ×1 IMPLANT
GLOVE BIOGEL PI INDICATOR 7.5 (GLOVE) ×2
GLOVE INDICATOR 8.0 STRL GRN (GLOVE) ×3 IMPLANT
GOWN STRL REUS W/ TWL LRG LVL3 (GOWN DISPOSABLE) ×1 IMPLANT
GOWN STRL REUS W/ TWL XL LVL3 (GOWN DISPOSABLE) ×2 IMPLANT
GOWN STRL REUS W/TWL LRG LVL3 (GOWN DISPOSABLE) ×2
GOWN STRL REUS W/TWL XL LVL3 (GOWN DISPOSABLE) ×4
IV LACTATED RINGER IRRG 3000ML (IV SOLUTION) ×2
IV LR IRRIG 3000ML ARTHROMATIC (IV SOLUTION) ×1 IMPLANT
KIT TURNOVER KIT A (KITS) ×3 IMPLANT
MANIFOLD NEPTUNE II (INSTRUMENTS) ×3 IMPLANT
NDL HYPO 21X1.5 SAFETY (NEEDLE) ×1 IMPLANT
NEEDLE HYPO 21X1.5 SAFETY (NEEDLE) ×3 IMPLANT
PACK ARTHROSCOPY KNEE (MISCELLANEOUS) ×3 IMPLANT
PENCIL ELECTRO HAND CTR (MISCELLANEOUS) ×3 IMPLANT
SUT PROLENE 4 0 PS 2 18 (SUTURE) ×3 IMPLANT
SUT TICRON COATED BLUE 2 0 30 (SUTURE) IMPLANT
SYR 50ML LL SCALE MARK (SYRINGE) ×3 IMPLANT
TUBING ARTHRO INFLOW-ONLY STRL (TUBING) ×3 IMPLANT
WAND 30 DEG SABER W/CORD (SURGICAL WAND) ×2 IMPLANT
WAND WEREWOLF FLOW 90D (MISCELLANEOUS) ×3 IMPLANT

## 2019-06-11 NOTE — TOC Benefit Eligibility Note (Signed)
Transition of Care [facility]) Benefit Eligibility Note    Patient Details  Name: Felicia Ellis MRN: [id number] Date of Birth: 1971-07-06   Medication/Dose: Enoxaparin 40 mg daily x 14 days  Covered?: Yes(Generic - Enoxaparin)  Tier: Other(Tier 1)  Prescription Coverage Preferred Pharmacy: [id number] or any in-network  Spoke with Person/Company/Phone Number:: Ascencion Dike, [phone]  Co-Pay: $5.00  Prior Approval: No     Additional Notes: Lovenox not on formulary    [place] Phone Number: 06/11/2019, 1:54 PM

## 2019-06-11 NOTE — Op Note (Signed)
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  Postoperative diagnosis:   Same with symptomatic medial shelf plica, left knee.  Procedure:   Arthroscopic abrasion chondroplasty of grade 3 chondromalacial changes involving the medial femoral condyle and patella, arthroscopic debridement of symptomatic plica, and arthroscopic lateral release, left knee.  Surgeon:   Pascal Lux, M.D.  Anesthesia:   General LMA.  Findings:   As above.  There was extensive grade 2-3 chondromalacial changes involving the weightbearing portion of the medial femoral condyle, as well as a more focal area of grade 2-3 chondromalacial changes along the medial edge of the medial femoral condyle, consistent with a "kissing lesion".  There also was a focal area of grade 3 chondromalacial changes involving the central ridge of the patella.  Laterally, there was perhaps grade 1 chondromalacial changes of the lateral tibial plateau, but the lateral femoral condyle was in excellent condition, as was the femoral trochlea.  The medial and lateral menisci both were in excellent condition, as were the anterior and posterior cruciate ligaments.  Complications:   None.  EBL:   5 cc.  Total fluids:   1000 cc of crystalloid.  Tourniquet time:   None  Drains:   None  Closure:   4-0 Prolene interrupted sutures.  Brief clinical note:   The patient is a 48 year old female with a several year history of gradually worsening anterior left knee pain. Her symptoms have persisted despite medications, activity modification, a steroid injection, etc. Her history and examination are consistent with patellofemoral syndrome. A preoperative MRI scan showed no evidence for meniscal or ligamentous pathology, but did demonstrate some early degenerative changes. The patient presents at this time for arthroscopy, debridement, and possible lateral  release.  Procedure:   The patient was brought into the operating room and lain in the supine position. After adequate general laryngeal mask anesthesia was obtained, a timeout was performed to verify the appropriate side. The patient's left knee was injected sterilely using a solution of 30 cc of 1% lidocaine and 30 cc of 0.5% Sensorcaine with epinephrine. The left lower extremity was prepped with ChloraPrep solution before being draped sterilely. Preoperative antibiotics were administered. The expected portal sites were injected with 0.5% Sensorcaine with epinephrine before the camera was placed in the anterolateral portal and instrumentation performed through the anteromedial portal.   The knee was sequentially examined beginning in the suprapatellar pouch, then progressing to the patellofemoral space, the medial gutter and compartment, the notch, and finally the lateral compartment and gutter. The findings were as described above. Abundant reactive synovial tissues anteriorly were debrided using the full-radius resector in order to improve visualization. Debridement of the reactive synovial tissues anteriorly exposed a large thickened plica medially which appeared to be abrading the medial edge of the medial femoral condyle. Therefore, this plica was debrided thoroughly so that it would no longer abrade the medial femoral condyle. The "kissing lesion" along the medial edge of the medial femoral condyle and the more central area of chondromalacia on the medial femoral condyle were debrided back to stable margins using the full-radius resector before the edges were lightly "annealed" with the ArthroCare wand. Laterally, the meniscus was intact to probing.  The camera was repositioned in the anteromedial portal and the shaver introduced the anterolateral portal. The area of grade 3 chondromalacia involving the central ridge of the patella was debrided back to stable margins  using the full-radius resector  before these margins also were lightly "annealed" with the ArthroCare wand.  The lateral patellofemoral compartment appeared to be tight on her preoperative exam. Based on this finding, in addition to the findings of degenerative changes along the central ridge of the patella, it was elected to proceed with a lateral retinacular release. This was performed using the ArthroCare saber device. The lateral release was performed from proximal to distal under arthroscopic visualization. The ArthroCare wand was reintroduced and used to obtain hemostasis before the instruments were removed from the joint after suctioning the excess fluid.   The portal sites were closed using 4-0 Prolene interrupted sutures before a sterile bulky dressing was applied to the knee. The patient was then awakened, extubated, and returned to the recovery room in satisfactory condition after tolerating the procedure well.

## 2019-06-11 NOTE — TOC Progression Note (Signed)
Transition of Care [facility]) - Progression Note    Patient Details  Name: Felicia Ellis MRN: [id number] Date of Birth: October 04, 1971  Transition of Care [facility]) CM/SW [place], RN Phone Number: 06/11/2019, 8:53 AM  Clinical Narrative:     Requested the price of Lovenox will notify the patient once obtained       Expected Discharge Plan and Services           Expected Discharge Date: 06/11/19                                     Social Determinants of Health (SDOH) Interventions    Readmission Risk Interventions No flowsheet data found.

## 2019-06-11 NOTE — H&P (Signed)
Paper H&P to be scanned into permanent record. H&P reviewed and patient re-examined. No changes. 

## 2019-06-11 NOTE — Anesthesia Preprocedure Evaluation (Addendum)
Anesthesia Evaluation  Patient identified by MRN, date of birth, ID band Patient awake    Reviewed: Allergy & Precautions, H&P , NPO status , reviewed documented beta blocker date and time   History of Anesthesia Complications (+) PONV and history of anesthetic complications  Airway Mallampati: II  TM Distance: >3 FB Neck ROM: full    Dental  (+) Teeth Intact, Caps   Pulmonary shortness of breath,    Pulmonary exam normal        Cardiovascular Normal cardiovascular exam     Neuro/Psych  Headaches, Anxiety    GI/Hepatic   Endo/Other    Renal/GU      Musculoskeletal   Abdominal   Peds  Hematology   Anesthesia Other Findings Past Medical History: No date: Anxiety No date: Complication of anesthesia     Comment:  migraines No date: Dyspnea No date: Fatty liver No date: Migraines (severe migraines w narcotics) No date: PONV (postoperative nausea and vomiting) No date: Seasonal allergies Past Surgical History: No date: ABDOMINAL HYSTERECTOMY No date: BLADDER REPAIR No date: CESAREAN SECTION BMI    Body Mass Index: 32.79 kg/m     Reproductive/Obstetrics                            Anesthesia Physical Anesthesia Plan  ASA: II  Anesthesia Plan: General   Post-op Pain Management:    Induction: Intravenous  PONV Risk Score and Plan: 4 or greater and Ondansetron, Treatment may vary due to age or medical condition, Midazolam and Dexamethasone  Airway Management Planned: LMA  Additional Equipment:   Intra-op Plan:   Post-operative Plan: Extubation in OR  Informed Consent: I have reviewed the patients History and Physical, chart, labs and discussed the procedure including the risks, benefits and alternatives for the proposed anesthesia with the patient or authorized representative who has indicated his/her understanding and acceptance.     Dental Advisory Given  Plan  Discussed with: CRNA  Anesthesia Plan Comments:         Anesthesia Quick Evaluation

## 2019-06-11 NOTE — Anesthesia Post-op Follow-up Note (Signed)
Anesthesia QCDR form completed.        

## 2019-06-11 NOTE — Discharge Instructions (Addendum)
Orthopedic discharge instructions: Keep dressing dry and intact.  May shower after dressing changed on post-op day #4 (Sunday).  Cover sutures with Band-Aids after drying off. Apply ice frequently to knee. Take Toradol every 6 hours with food for 5 days, then may take ibuprofen 600-800 mg TID or Lodine 400 mg BID with food as necessary. Take pain medication as prescribed or ES Tylenol when needed.  May weight-bear as tolerated - use crutches or walker as needed. Follow-up in 10-14 days or as scheduled.  AMBULATORY SURGERY  DISCHARGE INSTRUCTIONS   1) The drugs that you were given will stay in your system until tomorrow so for the next 24 hours you should not:  A) Drive an automobile B) Make any legal decisions C) Drink any alcoholic beverage   2) You may resume regular meals tomorrow.  Today it is better to start with liquids and gradually work up to solid foods.  You may eat anything you prefer, but it is better to start with liquids, then soup and crackers, and gradually work up to solid foods.   3) Please notify your doctor immediately if you have any unusual bleeding, trouble breathing, redness and pain at the surgery site, drainage, fever, or pain not relieved by medication.    4) Additional Instructions:        Please contact your physician with any problems or Same Day Surgery at [phone], Monday through Friday 6 am to 4 pm, or [place] at [facility] number at [phone].

## 2019-06-11 NOTE — Transfer of Care (Signed)
Immediate Anesthesia Transfer of Care Note  Patient: Felicia Ellis  Procedure(s) Performed: left knee arthroscopy with lateral retinacular release, abrasion of patella and medial chondyle, debridement of plica (Left )  Patient Location: PACU  Anesthesia Type:General  Level of Consciousness: drowsy  Airway & Oxygen Therapy: Patient Spontanous Breathing and Patient connected to nasal cannula oxygen  Post-op Assessment: Report given to RN and Post -op Vital signs reviewed and stable  Post vital signs: Reviewed and stable  Last Vitals:  Vitals Value Taken Time  BP 94/54 06/11/19 0841  Temp 35.8 C 06/11/19 0833  Pulse 80 06/11/19 0842  Resp 19 06/11/19 0842  SpO2 100 % 06/11/19 0842  Vitals shown include unvalidated device data.  Last Pain:  Vitals:   06/11/19 0833  TempSrc:   PainSc: Asleep         Complications: No apparent anesthesia complications

## 2019-06-11 NOTE — Anesthesia Postprocedure Evaluation (Signed)
Anesthesia Post Note  Patient: Felicia Ellis  Procedure(s) Performed: left knee arthroscopy with lateral retinacular release, abrasion of patella and medial chondyle, debridement of plica (Left )  Patient location during evaluation: PACU Anesthesia Type: General Level of consciousness: awake and alert Pain management: pain level controlled Vital Signs Assessment: post-procedure vital signs reviewed and stable Respiratory status: spontaneous breathing, nonlabored ventilation and respiratory function stable Cardiovascular status: blood pressure returned to baseline and stable Postop Assessment: no apparent nausea or vomiting Anesthetic complications: no     Last Vitals:  Vitals:   06/11/19 0944 06/11/19 1020  BP: 129/73 108/66  Pulse: 84   Resp: 18 20  Temp: (!) 36.1 C   SpO2: 98% 97%    Last Pain:  Vitals:   06/11/19 0944  TempSrc: Temporal  PainSc: 0-No pain                 Alphonsus Sias

## 2019-06-12 ENCOUNTER — Encounter: Payer: Self-pay | Admitting: Surgery

## 2019-09-10 ENCOUNTER — Other Ambulatory Visit: Payer: Self-pay | Admitting: Family Medicine

## 2019-09-10 DIAGNOSIS — Z1231 Encounter for screening mammogram for malignant neoplasm of breast: Secondary | ICD-10-CM

## 2019-09-24 ENCOUNTER — Other Ambulatory Visit: Payer: Self-pay | Admitting: Surgery

## 2019-10-02 ENCOUNTER — Other Ambulatory Visit: Payer: Self-pay | Admitting: Surgery

## 2019-10-02 DIAGNOSIS — M6751 Plica syndrome, right knee: Secondary | ICD-10-CM

## 2019-10-02 DIAGNOSIS — M94261 Chondromalacia, right knee: Secondary | ICD-10-CM

## 2019-10-06 ENCOUNTER — Ambulatory Visit
Admission: RE | Admit: 2019-10-06 | Discharge: 2019-10-06 | Disposition: A | Discharge status: Home / Self Care | Payer: Managed Care, Other (non HMO) | Source: Ambulatory Visit | Attending: Surgery | Admitting: Surgery

## 2019-10-06 ENCOUNTER — Other Ambulatory Visit: Payer: Self-pay

## 2019-10-06 DIAGNOSIS — M6751 Plica syndrome, right knee: Secondary | ICD-10-CM | POA: Diagnosis present

## 2019-10-06 DIAGNOSIS — M94261 Chondromalacia, right knee: Secondary | ICD-10-CM | POA: Diagnosis present

## 2019-10-09 ENCOUNTER — Other Ambulatory Visit: Payer: Self-pay

## 2019-10-09 ENCOUNTER — Other Ambulatory Visit
Admission: RE | Admit: 2019-10-09 | Discharge: 2019-10-09 | Disposition: A | Discharge status: Home / Self Care | Payer: Managed Care, Other (non HMO) | Source: Ambulatory Visit | Attending: Surgery | Admitting: Surgery

## 2019-10-09 DIAGNOSIS — Z20828 Contact with and (suspected) exposure to other viral communicable diseases: Secondary | ICD-10-CM | POA: Insufficient documentation

## 2019-10-09 DIAGNOSIS — Z01812 Encounter for preprocedural laboratory examination: Secondary | ICD-10-CM | POA: Insufficient documentation

## 2019-10-09 HISTORY — DX: Abnormal levels of other serum enzymes: R74.8

## 2019-10-09 HISTORY — DX: Pain in unspecified knee: M25.569

## 2019-10-09 LAB — SARS CORONAVIRUS 2 (TAT 6-24 HRS): SARS Coronavirus 2: NEGATIVE

## 2019-10-09 NOTE — Patient Instructions (Signed)
Your procedure is scheduled on: 10/14/2019 Wed Report to Same Day Surgery 2nd floor medical mall [facility] Entrance-take elevator on left to 2nd floor.  Check in with surgery information desk.) To find out your arrival time please call [phone] between 1PM - 3PM on 10/13/2019 Tues  Remember: Instructions that are not followed completely may result in serious medical risk, up to and including death, or upon the discretion of your surgeon and anesthesiologist your surgery may need to be rescheduled.    _x___ 1. Do not eat food after midnight the night before your procedure. You may drink clear liquids up to 2 hours before you are scheduled to arrive at the hospital for your procedure.  Do not drink clear liquids within 2 hours of your scheduled arrival to the hospital.  Clear liquids include  --Water or Apple juice without pulp  --Clear carbohydrate beverage such as ClearFast or Gatorade  --Black Coffee or Clear Tea (No milk, no creamers, do not add anything to                  the coffee or Tea Type 1 and type 2 diabetics should only drink water.   ____Ensure clear carbohydrate drink on the way to the hospital for bariatric patients  ____Ensure clear carbohydrate drink 3 hours before surgery.   No gum chewing or hard candies.     __x__ 2. No Alcohol for 24 hours before or after surgery.   __x__3. No Smoking or e-cigarettes for 24 prior to surgery.  Do not use any chewable tobacco products for at least 6 hour prior to surgery   ____  4. Bring all medications with you on the day of surgery if instructed.    __x__ 5. Notify your doctor if there is any change in your medical condition     (cold, fever, infections).    x___6. On the morning of surgery brush your teeth with toothpaste and water.  You may rinse your mouth with mouth wash if you wish.  Do not swallow any toothpaste or mouthwash.   Do not wear jewelry, make-up, hairpins, clips or nail polish.  Do not wear lotions,  powders, or perfumes. You may wear deodorant.  Do not shave 48 hours prior to surgery. Men may shave face and neck.  Do not bring valuables to the hospital.    [facility] is not responsible for any belongings or valuables.               Contacts, dentures or bridgework may not be worn into surgery.  Leave your suitcase in the car. After surgery it may be brought to your room.  For patients admitted to the hospital, discharge time is determined by your                       treatment team.  _  Patients discharged the day of surgery will not be allowed to drive home.  You will need someone to drive you home and stay with you the night of your procedure.    Please read over the following fact sheets that you were given:   [facility] Preparing for Surgery and or MRSA Information   _x___ Take anti-hypertensive listed below, cardiac, seizure, asthma,     anti-reflux and psychiatric medicines. These include:  1. None  2.  3.  4.  5.  6.  ____Fleets enema or Magnesium Citrate as directed.   _x___ Use CHG Soap or  sage wipes as directed on instruction sheet   ____ Use inhalers on the day of surgery and bring to hospital day of surgery  ____ Stop Metformin and Janumet 2 days prior to surgery.    ____ Take 1/2 of usual insulin dose the night before surgery and none on the morning     surgery.   _x___ Follow recommendations from Cardiologist, Pulmonologist or PCP regarding          stopping Aspirin, Coumadin, Plavix ,Eliquis, Effient, or Pradaxa, and Pletal.  X____Stop Anti-inflammatories such as Advil, Aleve, Ibuprofen, Motrin, Naproxen, Naprosyn, Goodies powders or aspirin products. OK to take Tylenol and                          Celebrex.   _x___ Stop supplements until after surgery.  But may continue Vitamin D, Vitamin B,       and multivitamin.   ____ Bring C-Pap to the hospital.

## 2019-10-12 ENCOUNTER — Other Ambulatory Visit: Payer: Managed Care, Other (non HMO)

## 2019-10-13 MED ORDER — CLINDAMYCIN PHOSPHATE 900 MG/50ML IV SOLN
900.0000 mg | Freq: Once | INTRAVENOUS | Status: AC
  Administered 2019-10-14: 10:00:00 900 mg via INTRAVENOUS

## 2019-10-14 ENCOUNTER — Ambulatory Visit
Admission: RE | Admit: 2019-10-14 | Discharge: 2019-10-14 | Disposition: A | Discharge status: Home / Self Care | Payer: Managed Care, Other (non HMO) | Attending: Surgery | Admitting: Surgery

## 2019-10-14 ENCOUNTER — Encounter
Admission: RE | Disposition: A | Discharge status: Home / Self Care | Payer: Self-pay | Source: Home / Self Care | Attending: Surgery

## 2019-10-14 ENCOUNTER — Other Ambulatory Visit: Payer: Self-pay

## 2019-10-14 ENCOUNTER — Ambulatory Visit: Payer: Managed Care, Other (non HMO) | Admitting: Anesthesiology

## 2019-10-14 DIAGNOSIS — K760 Fatty (change of) liver, not elsewhere classified: Secondary | ICD-10-CM | POA: Diagnosis not present

## 2019-10-14 DIAGNOSIS — M222X1 Patellofemoral disorders, right knee: Secondary | ICD-10-CM | POA: Insufficient documentation

## 2019-10-14 DIAGNOSIS — F419 Anxiety disorder, unspecified: Secondary | ICD-10-CM | POA: Diagnosis not present

## 2019-10-14 DIAGNOSIS — R0602 Shortness of breath: Secondary | ICD-10-CM | POA: Insufficient documentation

## 2019-10-14 DIAGNOSIS — Z90710 Acquired absence of both cervix and uterus: Secondary | ICD-10-CM | POA: Insufficient documentation

## 2019-10-14 DIAGNOSIS — M2241 Chondromalacia patellae, right knee: Secondary | ICD-10-CM | POA: Insufficient documentation

## 2019-10-14 DIAGNOSIS — Z885 Allergy status to narcotic agent status: Secondary | ICD-10-CM | POA: Diagnosis not present

## 2019-10-14 DIAGNOSIS — M6751 Plica syndrome, right knee: Secondary | ICD-10-CM | POA: Diagnosis present

## 2019-10-14 DIAGNOSIS — Z888 Allergy status to other drugs, medicaments and biological substances status: Secondary | ICD-10-CM | POA: Diagnosis not present

## 2019-10-14 DIAGNOSIS — Z820 Family history of epilepsy and other diseases of the nervous system: Secondary | ICD-10-CM | POA: Insufficient documentation

## 2019-10-14 DIAGNOSIS — Z79899 Other long term (current) drug therapy: Secondary | ICD-10-CM | POA: Insufficient documentation

## 2019-10-14 DIAGNOSIS — Z880 Allergy status to penicillin: Secondary | ICD-10-CM | POA: Insufficient documentation

## 2019-10-14 DIAGNOSIS — Z8249 Family history of ischemic heart disease and other diseases of the circulatory system: Secondary | ICD-10-CM | POA: Diagnosis not present

## 2019-10-14 DIAGNOSIS — G43909 Migraine, unspecified, not intractable, without status migrainosus: Secondary | ICD-10-CM | POA: Insufficient documentation

## 2019-10-14 HISTORY — PX: KNEE ARTHROSCOPY: SHX127

## 2019-10-14 SURGERY — ARTHROSCOPY, KNEE
Anesthesia: General | Site: Knee | Laterality: Right

## 2019-10-14 MED ORDER — ONDANSETRON HCL 4 MG/2ML IJ SOLN
INTRAMUSCULAR | Status: DC | PRN
  Administered 2019-10-14 (×2): 4 mg via INTRAVENOUS

## 2019-10-14 MED ORDER — LIDOCAINE HCL (CARDIAC) PF 100 MG/5ML IV SOSY
PREFILLED_SYRINGE | INTRAVENOUS | Status: DC | PRN
  Administered 2019-10-14: 100 mg via INTRAVENOUS

## 2019-10-14 MED ORDER — LIDOCAINE HCL 1 % IJ SOLN
INTRAMUSCULAR | Status: DC | PRN
  Administered 2019-10-14: 20 mL
  Administered 2019-10-14: 15 mL

## 2019-10-14 MED ORDER — TRAMADOL HCL 50 MG PO TABS: 50.0000 mg | ORAL_TABLET | Freq: Four times a day (QID) | ORAL | 0 refills | Status: AC | PRN

## 2019-10-14 MED ORDER — DEXAMETHASONE SODIUM PHOSPHATE 10 MG/ML IJ SOLN
INTRAMUSCULAR | Status: DC | PRN
  Administered 2019-10-14: 10 mg via INTRAVENOUS

## 2019-10-14 MED ORDER — FAMOTIDINE 20 MG PO TABS
20.0000 mg | ORAL_TABLET | Freq: Once | ORAL | Status: AC
  Administered 2019-10-14: 09:00:00 20 mg via ORAL

## 2019-10-14 MED ORDER — FENTANYL CITRATE (PF) 100 MCG/2ML IJ SOLN
INTRAMUSCULAR | Status: DC | PRN
  Administered 2019-10-14 (×2): 50 ug via INTRAVENOUS

## 2019-10-14 MED ORDER — FENTANYL CITRATE (PF) 100 MCG/2ML IJ SOLN: 25.0000 ug | INTRAMUSCULAR | Status: DC | PRN

## 2019-10-14 MED ORDER — ONDANSETRON HCL 4 MG/2ML IJ SOLN: 4.0000 mg | Freq: Once | INTRAMUSCULAR | Status: DC | PRN

## 2019-10-14 MED ORDER — GLYCOPYRROLATE 0.2 MG/ML IJ SOLN
INTRAMUSCULAR | Status: DC | PRN
  Administered 2019-10-14: 0.2 mg via INTRAVENOUS

## 2019-10-14 MED ORDER — BUPIVACAINE-EPINEPHRINE (PF) 0.5% -1:200000 IJ SOLN
INTRAMUSCULAR | Status: DC | PRN
  Administered 2019-10-14: 15 mL via PERINEURAL

## 2019-10-14 MED ORDER — BUPIVACAINE HCL (PF) 0.5 % IJ SOLN
INTRAMUSCULAR | Status: AC
  Filled 2019-10-14: qty 60

## 2019-10-14 MED ORDER — MIDAZOLAM HCL 2 MG/2ML IJ SOLN
INTRAMUSCULAR | Status: AC
  Filled 2019-10-14: qty 2

## 2019-10-14 MED ORDER — FAMOTIDINE 20 MG PO TABS
ORAL_TABLET | ORAL | Status: AC
  Filled 2019-10-14: qty 1

## 2019-10-14 MED ORDER — LACTATED RINGERS IV SOLN
INTRAVENOUS | Status: DC
  Administered 2019-10-14 (×2): via INTRAVENOUS

## 2019-10-14 MED ORDER — EPINEPHRINE PF 1 MG/ML IJ SOLN
INTRAMUSCULAR | Status: AC
  Filled 2019-10-14: qty 1

## 2019-10-14 MED ORDER — MIDAZOLAM HCL 2 MG/2ML IJ SOLN
INTRAMUSCULAR | Status: DC | PRN
  Administered 2019-10-14: 2 mg via INTRAVENOUS

## 2019-10-14 MED ORDER — CLINDAMYCIN PHOSPHATE 900 MG/50ML IV SOLN
INTRAVENOUS | Status: AC
  Filled 2019-10-14: qty 50

## 2019-10-14 MED ORDER — PROPOFOL 10 MG/ML IV BOLUS
INTRAVENOUS | Status: DC | PRN
  Administered 2019-10-14: 50 mg via INTRAVENOUS
  Administered 2019-10-14: 150 mg via INTRAVENOUS
  Administered 2019-10-14: 50 mg via INTRAVENOUS

## 2019-10-14 MED ORDER — ACETAMINOPHEN 10 MG/ML IV SOLN
INTRAVENOUS | Status: AC
  Filled 2019-10-14: qty 100

## 2019-10-14 MED ORDER — LIDOCAINE HCL (PF) 1 % IJ SOLN
INTRAMUSCULAR | Status: AC
  Filled 2019-10-14: qty 60

## 2019-10-14 MED ORDER — KETOROLAC TROMETHAMINE 10 MG PO TABS: 10.0000 mg | ORAL_TABLET | Freq: Four times a day (QID) | ORAL | 0 refills | Status: DC

## 2019-10-14 MED ORDER — FENTANYL CITRATE (PF) 100 MCG/2ML IJ SOLN
INTRAMUSCULAR | Status: AC
  Filled 2019-10-14: qty 2

## 2019-10-14 SURGICAL SUPPLY — 40 items
"PENCIL ELECTRO HAND CTR " (MISCELLANEOUS) ×1 IMPLANT
BAG COUNTER SPONGE EZ (MISCELLANEOUS) IMPLANT
BLADE FULL RADIUS 3.5 (BLADE) ×3 IMPLANT
BLADE SHAVER 4.5X7 STR FR (MISCELLANEOUS) ×1 IMPLANT
BNDG ELASTIC 6X5.8 VLCR STR LF (GAUZE/BANDAGES/DRESSINGS) ×3 IMPLANT
CHLORAPREP W/TINT 26 (MISCELLANEOUS) ×3 IMPLANT
COUNTER SPONGE BAG EZ (MISCELLANEOUS)
COVER WAND RF STERILE (DRAPES) ×3 IMPLANT
CUFF TOURN SGL QUICK 24 (TOURNIQUET CUFF)
CUFF TOURN SGL QUICK 30 (TOURNIQUET CUFF) ×2
CUFF TRNQT CYL 24X4X16.5-23 (TOURNIQUET CUFF) IMPLANT
CUFF TRNQT CYL 30X4X21-28X (TOURNIQUET CUFF) IMPLANT
DRAPE SPLIT 6X30 W/TAPE (DRAPES) ×3 IMPLANT
ELECT REM PT RETURN 9FT ADLT (ELECTROSURGICAL) ×3
ELECTRODE REM PT RTRN 9FT ADLT (ELECTROSURGICAL) ×1 IMPLANT
GAUZE PETROLATUM 1 X8 (GAUZE/BANDAGES/DRESSINGS) ×2 IMPLANT
GAUZE SPONGE 4X4 12PLY STRL (GAUZE/BANDAGES/DRESSINGS) ×3 IMPLANT
GLOVE BIO SURGEON STRL SZ8 (GLOVE) ×6 IMPLANT
GLOVE BIOGEL M 7.0 STRL (GLOVE) ×6 IMPLANT
GLOVE BIOGEL PI IND STRL 7.5 (GLOVE) ×1 IMPLANT
GLOVE BIOGEL PI INDICATOR 7.5 (GLOVE) ×2
GLOVE INDICATOR 8.0 STRL GRN (GLOVE) ×3 IMPLANT
GOWN STRL REUS W/ TWL LRG LVL3 (GOWN DISPOSABLE) ×1 IMPLANT
GOWN STRL REUS W/ TWL XL LVL3 (GOWN DISPOSABLE) ×2 IMPLANT
GOWN STRL REUS W/TWL LRG LVL3 (GOWN DISPOSABLE) ×2
GOWN STRL REUS W/TWL XL LVL3 (GOWN DISPOSABLE) ×2
IV LACTATED RINGER IRRG 3000ML (IV SOLUTION) ×2
IV LR IRRIG 3000ML ARTHROMATIC (IV SOLUTION) ×1 IMPLANT
KIT TURNOVER KIT A (KITS) ×3 IMPLANT
MANIFOLD NEPTUNE II (INSTRUMENTS) ×3 IMPLANT
NDL HYPO 21X1.5 SAFETY (NEEDLE) ×1 IMPLANT
NEEDLE HYPO 21X1.5 SAFETY (NEEDLE) ×3 IMPLANT
PACK ARTHROSCOPY KNEE (MISCELLANEOUS) ×3 IMPLANT
PENCIL ELECTRO HAND CTR (MISCELLANEOUS) ×3 IMPLANT
SUT PROLENE 4 0 PS 2 18 (SUTURE) ×3 IMPLANT
SUT TICRON COATED BLUE 2 0 30 (SUTURE) IMPLANT
SYR 50ML LL SCALE MARK (SYRINGE) ×3 IMPLANT
TUBING ARTHRO INFLOW-ONLY STRL (TUBING) ×3 IMPLANT
WAND 30 DEG SABER W/CORD (SURGICAL WAND) ×2 IMPLANT
WAND WEREWOLF FLOW 90D (MISCELLANEOUS) ×1 IMPLANT

## 2019-10-14 NOTE — Anesthesia Procedure Notes (Signed)
Procedure Name: LMA Insertion Performed by: Kelton Pillar, CRNA Pre-anesthesia Checklist: Patient identified, Emergency Drugs available, Suction available and Patient being monitored Patient Re-evaluated:Patient Re-evaluated prior to induction Oxygen Delivery Method: Circle system utilized Preoxygenation: Pre-oxygenation with 100% oxygen Induction Type: IV induction LMA: LMA inserted LMA Size: 4.0 Grade View: Grade I Tube type: Oral Tube size: 4.0 mm Number of attempts: 1 Placement Confirmation: positive ETCO2,  CO2 detector and breath sounds checked- equal and bilateral Tube secured with: Tape

## 2019-10-14 NOTE — Discharge Instructions (Addendum)
AMBULATORY SURGERY  DISCHARGE INSTRUCTIONS   1) The drugs that you were given will stay in your system until tomorrow so for the next 24 hours you should not:  A) Drive an automobile B) Make any legal decisions C) Drink any alcoholic beverage   2) You may resume regular meals tomorrow.  Today it is better to start with liquids and gradually work up to solid foods.  You may eat anything you prefer, but it is better to start with liquids, then soup and crackers, and gradually work up to solid foods.   3) Please notify your doctor immediately if you have any unusual bleeding, trouble breathing, redness and pain at the surgery site, drainage, fever, or pain not relieved by medication.    4) Additional Instructions:        Please contact your physician with any problems or Same Day Surgery at [phone], Monday through Friday 6 am to 4 pm, or [place] at [facility] number at [phone].Orthopedic discharge instructions: Keep dressing dry and intact.  May shower after dressing changed on post-op day #4 (Sunday).  Cover sutures with Band-Aids after drying off. Apply ice frequently to knee. Take ibuprofen 600-800 mg TID with meals for 7-10 days, then as necessary. Take pain medication as prescribed or ES Tylenol when needed.  May weight-bear as tolerated - use crutches or walker as needed. Follow-up in 10-14 days or as scheduled.

## 2019-10-14 NOTE — Anesthesia Postprocedure Evaluation (Signed)
Anesthesia Post Note  Patient: Felicia Ellis  Procedure(s) Performed: KNEE ARTHROSCOPY WITH DEBRIDEMENT, POSSIBLE EXCISION OF A PLICA, AND POSSIBLE LATERAL RELEASE (Right Knee)  Patient location during evaluation: PACU Anesthesia Type: General Level of consciousness: awake and alert and oriented Pain management: pain level controlled Vital Signs Assessment: post-procedure vital signs reviewed and stable Respiratory status: spontaneous breathing Cardiovascular status: blood pressure returned to baseline Anesthetic complications: no     Last Vitals:  Vitals:   10/14/19 1155 10/14/19 1200  BP: 118/80 111/77  Pulse: 85 86  Resp: 18 20  Temp:  (!) 36.1 C  SpO2: 95% 96%    Last Pain:  Vitals:   10/14/19 1200  TempSrc: Temporal  PainSc: 0-No pain                 Rovena Hearld

## 2019-10-14 NOTE — Anesthesia Preprocedure Evaluation (Signed)
Anesthesia Evaluation  Patient identified by MRN, date of birth, ID band Patient awake    Reviewed: Allergy & Precautions, H&P , NPO status , reviewed documented beta blocker date and time   History of Anesthesia Complications (+) PONV and history of anesthetic complications  Airway Mallampati: II  TM Distance: >3 FB Neck ROM: full    Dental  (+) Teeth Intact, Caps   Pulmonary shortness of breath and with exertion,    Pulmonary exam normal        Cardiovascular negative cardio ROS Normal cardiovascular exam     Neuro/Psych  Headaches, PSYCHIATRIC DISORDERS Anxiety    GI/Hepatic negative GI ROS,   Endo/Other  negative endocrine ROS  Renal/GU negative Renal ROS  negative genitourinary   Musculoskeletal   Abdominal   Peds negative pediatric ROS (+)  Hematology negative hematology ROS (+)   Anesthesia Other Findings Past Medical History: No date: Anxiety No date: Complication of anesthesia     Comment:  migraines No date: Dyspnea No date: Fatty liver No date: Migraines (severe migraines w narcotics) No date: PONV (postoperative nausea and vomiting) No date: Seasonal allergies Past Surgical History: No date: ABDOMINAL HYSTERECTOMY No date: BLADDER REPAIR No date: CESAREAN SECTION BMI    Body Mass Index: 32.79 kg/m     Reproductive/Obstetrics                             Anesthesia Physical  Anesthesia Plan  ASA: II  Anesthesia Plan: General   Post-op Pain Management:    Induction: Intravenous  PONV Risk Score and Plan: 4 or greater and Ondansetron, Treatment may vary due to age or medical condition, Midazolam and Dexamethasone  Airway Management Planned: LMA  Additional Equipment:   Intra-op Plan:   Post-operative Plan: Extubation in OR  Informed Consent: I have reviewed the patients History and Physical, chart, labs and discussed the procedure including the risks,  benefits and alternatives for the proposed anesthesia with the patient or authorized representative who has indicated his/her understanding and acceptance.     Dental Advisory Given  Plan Discussed with: CRNA  Anesthesia Plan Comments:         Anesthesia Quick Evaluation

## 2019-10-14 NOTE — Op Note (Signed)
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  Procedure:   Arthroscopic debridement of symptomatic medial shelf plica, arthroscopic abrasion chondroplasty of patella and medial femoral condyle, and arthroscopic lateral release, right knee.  Surgeon:   Pascal Lux, M.D.  Anesthesia:   General LMA.  Findings:   As above. The medial and lateral menisci both were in excellent condition, as were the anterior and posterior cruciate ligaments. There were grade 3 chondromalacial changes involving the superolateral patellar facet, and focal grade 2-3 chondromalacial changes involving the medial edge of the medial femoral condyle, consistent with a "kissing lesion". There also was grade 1 "softening" of the lateral tibial plateau. The remaining articular surfaces all were in satisfactory condition. The medial and lateral menisci were in excellent condition, as were the anterior and posterior cruciate ligaments.  Complications:   None.  EBL:   5 cc.  Total fluids:   500 cc of crystalloid.  Tourniquet time:   None  Drains:   None  Closure:   4-0 Prolene interrupted sutures.  Brief clinical note:   The patient is a 48 year old female with a history of progressively worsening anterior and medial left knee pain. Her symptoms have progressed despite medications, activity modification, etc. Her history and examination were consistent with patellofemoral syndrome with a probable symptomatic medial shelf plica. The patient presents at this time for arthroscopy, debridement of the symptomatic plica, and possible lateral release.  Procedure:   The patient was brought into the operating room and lain in the supine position. After  adequate general laryngeal mask anesthesia was obtained, a timeout was performed to verify the appropriate side. The patient's right knee was injected sterilely using a solution of 30 cc of 1% lidocaine and 30 cc of 0.5% Sensorcaine with epinephrine. The right lower extremity was prepped with ChloraPrep solution before being draped sterilely. Preoperative antibiotics were administered. The expected portal sites were injected with 0.5% Sensorcaine with epinephrine before the camera was placed in the anterolateral portal and instrumentation performed through the anteromedial portal.   The knee was sequentially examined beginning in the suprapatellar pouch, then progressing to the patellofemoral space, the medial gutter and compartment, the notch, and finally the lateral compartment and gutter. The findings were as described above. Abundant reactive synovial tissues anteriorly were debrided using the full-radius resector in order to improve visualization. Debridement of these reactive synovial tissues anteriorly demonstrated the presence of a thickened medial shelf plica that appeared to be abrading the medial edge of the medial femoral condyle. The "kissing lesion" was debrided back to stable margins using the full-radius resector before the plica itself was debrided fully using the full-radius resector.  The camera was repositioned in the anteromedial portal and the shaver reintroduced into the anterolateral portal. Additional reactive synovial tissues were debrided anterolaterally in order to facilitate visualization of the lateral retinaculum. In addition, the full-radius resector was used to perform an abrasion chondroplasty of the grade 3 chondromalacial changes involving the lateral patellar facet. The full-radius resector was removed and the ArthroCare Saber device inserted and used to perform a lateral release. The lateral retinaculum was released under arthroscopic visualization. A second pass was  performed to ensure complete release of the lateral retinaculum.  Hemostasis was achieved using the coagulation component  of the saber device. The instruments were removed from the joint after suctioning the excess fluid.   The portal sites were closed using 4-0 Prolene interrupted sutures before a sterile bulky dressing was applied to the knee. The patient was then awakened, extubated, and returned to the recovery room in satisfactory condition after tolerating the procedure well.

## 2019-10-14 NOTE — Transfer of Care (Signed)
Immediate Anesthesia Transfer of Care Note  Patient: Felicia Ellis  Procedure(s) Performed: KNEE ARTHROSCOPY WITH DEBRIDEMENT, POSSIBLE EXCISION OF A PLICA, AND POSSIBLE LATERAL RELEASE (Right Knee)  Patient Location: PACU  Anesthesia Type:General  Level of Consciousness: drowsy, patient cooperative and responds to stimulation  Airway & Oxygen Therapy: Patient Spontanous Breathing and Patient connected to face mask oxygen  Post-op Assessment: Report given to RN and Post -op Vital signs reviewed and stable  Post vital signs: Reviewed and stable  Last Vitals:  Vitals Value Taken Time  BP 115/71 10/14/19 1056  Temp 36.6 C 10/14/19 1056  Pulse 95 10/14/19 1057  Resp 0 10/14/19 1057  SpO2 100 % 10/14/19 1057  Vitals shown include unvalidated device data.  Last Pain:  Vitals:   10/14/19 0841  TempSrc: Temporal      Patients Stated Pain Goal: 3 ([id number])  Complications: No apparent anesthesia complications

## 2019-10-14 NOTE — H&P (Signed)
Paper H&P to be scanned into permanent record. H&P reviewed and patient re-examined. No changes. 

## 2019-10-14 NOTE — Anesthesia Post-op Follow-up Note (Signed)
Anesthesia QCDR form completed.        

## 2019-10-15 ENCOUNTER — Encounter: Payer: Self-pay | Admitting: Surgery

## 2019-12-09 ENCOUNTER — Ambulatory Visit
Admission: RE | Admit: 2019-12-09 | Discharge: 2019-12-09 | Disposition: A | Discharge status: Home / Self Care | Payer: Managed Care, Other (non HMO) | Source: Ambulatory Visit | Attending: Family Medicine | Admitting: Family Medicine

## 2019-12-09 DIAGNOSIS — Z1231 Encounter for screening mammogram for malignant neoplasm of breast: Secondary | ICD-10-CM | POA: Insufficient documentation

## 2020-09-21 ENCOUNTER — Other Ambulatory Visit: Payer: Self-pay | Admitting: Dermatology

## 2020-09-21 ENCOUNTER — Encounter: Payer: Self-pay | Admitting: Dermatology

## 2020-09-21 ENCOUNTER — Other Ambulatory Visit: Payer: Self-pay

## 2020-09-21 ENCOUNTER — Ambulatory Visit (INDEPENDENT_AMBULATORY_CARE_PROVIDER_SITE_OTHER): Payer: Managed Care, Other (non HMO) | Admitting: Dermatology

## 2020-09-21 DIAGNOSIS — R202 Paresthesia of skin: Secondary | ICD-10-CM | POA: Diagnosis not present

## 2020-09-21 DIAGNOSIS — L813 Cafe au lait spots: Secondary | ICD-10-CM | POA: Diagnosis not present

## 2020-09-21 DIAGNOSIS — L814 Other melanin hyperpigmentation: Secondary | ICD-10-CM

## 2020-09-21 DIAGNOSIS — B079 Viral wart, unspecified: Secondary | ICD-10-CM

## 2020-09-21 DIAGNOSIS — Z1283 Encounter for screening for malignant neoplasm of skin: Secondary | ICD-10-CM | POA: Diagnosis not present

## 2020-09-21 DIAGNOSIS — D229 Melanocytic nevi, unspecified: Secondary | ICD-10-CM

## 2020-09-21 DIAGNOSIS — L590 Erythema ab igne [dermatitis ab igne]: Secondary | ICD-10-CM

## 2020-09-21 DIAGNOSIS — D485 Neoplasm of uncertain behavior of skin: Secondary | ICD-10-CM | POA: Diagnosis not present

## 2020-09-21 DIAGNOSIS — L578 Other skin changes due to chronic exposure to nonionizing radiation: Secondary | ICD-10-CM

## 2020-09-21 DIAGNOSIS — L821 Other seborrheic keratosis: Secondary | ICD-10-CM

## 2020-09-21 DIAGNOSIS — D489 Neoplasm of uncertain behavior, unspecified: Secondary | ICD-10-CM

## 2020-09-21 DIAGNOSIS — D1800 Hemangioma unspecified site: Secondary | ICD-10-CM

## 2020-09-21 DIAGNOSIS — L918 Other hypertrophic disorders of the skin: Secondary | ICD-10-CM

## 2020-09-21 NOTE — Progress Notes (Signed)
Follow-Up Visit   Subjective  Felicia Ellis is a 49 y.o. female who presents for the following: TBSE.  Patient here for full body skin exam and skin cancer screening.  Patient has a few areas of concern on her right temple and under left breast. Patient has no skin cancer history.   The following portions of the chart were reviewed this encounter and updated as appropriate:  Tobacco  Allergies  Meds  Problems  Med Hx  Surg Hx  Fam Hx      Review of Systems:  No other skin or systemic complaints except as noted in HPI or Assessment and Plan.  Objective  Well appearing patient in no apparent distress; mood and affect are within normal limits.  A full examination was performed including scalp, head, eyes, ears, nose, lips, neck, chest, axillae, abdomen, back, buttocks, bilateral upper extremities, bilateral lower extremities, hands, feet, fingers, toes, fingernails, and toenails. All findings within normal limits unless otherwise noted below.  Objective  Mid Back: Hyperpigmented patch  Objective  Left Mid Back, Right Inferior Buttock:  tan macule  Objective  Left Lower Back: Mottled hyperpigmented atrophic patches in net-like pattern  Objective  Right Axilla: Fleshy, skin-colored pedunculated papules.    Objective  Left 2nd finger MCP, Left Thumb: Verrucous papules  Objective  Left Inframammary: 0.2 cm erythematous tan papule     Objective  Left Axilla: 0.4 erythematous flesh papule      Assessment & Plan  Notalgia paresthetica Mid Back  Recommend Gold Bond Rapid Relief Anti-Itch cream up to 3 times per day to areas that are itchy. Recommend capsicin cream 5 to 6 times daily x 1 month then decrease gradually for possible longer term improvement  Cafe au lait spots (2) Right Inferior Buttock; Left Mid Back  Benign-appearing.  Observation.  Call clinic for new or changing lesions.  Recommend daily use of broad spectrum spf 30+ sunscreen to  sun-exposed areas.    Erythema ab igne Left Lower Back  Benign-appearing.  Observation.     Recommend turning down heating pad at night   Acrochordon Right Axilla  Benign-appearing.  Observation.  Call clinic for new or changing lesions.  Recommend daily use of broad spectrum spf 30+ sunscreen to sun-exposed areas.    Viral warts, unspecified type (2) Left 2nd finger MCP; Left Thumb  Discussed viral etiology and risk of spread.  Discussed multiple treatments may be required to clear warts.  Discussed possible post-treatment dyspigmentation and risk of recurrence.   Patient deferred treatment today  Neoplasm of uncertain behavior (2) Left Inframammary  Epidermal / dermal shaving  Lesion diameter (cm):  0.2 Informed consent: discussed and consent obtained   Anesthesia: the lesion was anesthetized in a standard fashion   Anesthetic:  1% lidocaine w/ epinephrine 1-100,000 buffered w/ 8.4% NaHCO3 Instrument used: scissors   Hemostasis achieved with: pressure, aluminum chloride and electrodesiccation   Outcome: patient tolerated procedure well   Post-procedure details: wound care instructions given   Post-procedure details comment:  Ointment and bandage applied  Left Axilla  Epidermal / dermal shaving  Lesion diameter (cm):  0.4 Informed consent: discussed and consent obtained   Anesthesia: the lesion was anesthetized in a standard fashion   Anesthetic:  1% lidocaine w/ epinephrine 1-100,000 buffered w/ 8.4% NaHCO3 Instrument used: scissors   Hemostasis achieved with: pressure, aluminum chloride and electrodesiccation   Outcome: patient tolerated procedure well   Post-procedure details: wound care instructions given   Post-procedure details comment:  Ointment and bandage applied  0.4 erythematous flesh papule Left Axilla R/O Irritated acrochordon vs neurofibroma   0.2 erythematous tan papule  Left Inframammary  R/O irritated acrochordon vs sk   Both biopsies  sent to [facility]  Other Related Procedures Anatomic Pathology Report Anatomic Pathology Report   Lentigines - Scattered tan macules - Discussed due to sun exposure - Benign, observe - Call for any changes  Seborrheic Keratoses - Stuck-on, waxy, tan-brown papules and plaques  - Discussed benign etiology and prognosis. - Observe - Call for any changes  Melanocytic Nevi - Tan-brown and/or pink-flesh-colored symmetric macules and papules - Benign appearing on exam today - Observation - Call clinic for new or changing moles - Recommend daily use of broad spectrum spf 30+ sunscreen to sun-exposed areas.   Hemangiomas - Red papules - Discussed benign nature - Observe - Call for any changes  Actinic Damage - diffuse scaly erythematous macules with underlying dyspigmentation - Recommend daily broad spectrum sunscreen SPF 30+ to sun-exposed areas, reapply every 2 hours as needed.  - Call for new or changing lesions.  Skin cancer screening performed today.   Return in about 1 year (around 09/21/2021) for TBSE.  I, Donzetta Kohut, CMA, am acting as scribe for Forest Gleason, MD .  Documentation: I have reviewed the above documentation for accuracy and completeness, and I agree with the above.  Forest Gleason, MD

## 2020-09-21 NOTE — Patient Instructions (Addendum)
Recommend daily broad spectrum sunscreen SPF 30+ to sun-exposed areas, reapply every 2 hours as needed. Call for new or changing lesions.   Discussed viral etiology and risk of spread.  Discussed multiple treatments may be required to clear warts.  Discussed possible post-treatment dyspigmentation and risk of recurrence.  Wound Care Instructions  1. Cleanse wound gently with soap and water once a day then pat dry with clean gauze. Apply a thing coat of Petrolatum (petroleum jelly, "Vaseline") over the wound (unless you have an allergy to this). We recommend that you use a new, sterile tube of Vaseline. Do not pick or remove scabs. Do not remove the yellow or white "healing tissue" from the base of the wound.  2. Cover the wound with fresh, clean, nonstick gauze and secure with paper tape. You may use Band-Aids in place of gauze and tape if the would is small enough, but would recommend trimming much of the tape off as there is often too much. Sometimes Band-Aids can irritate the skin.  3. You should call the office for your biopsy report after 1 week if you have not already been contacted.  4. If you experience any problems, such as abnormal amounts of bleeding, swelling, significant bruising, significant pain, or evidence of infection, please call the office immediately.  5. FOR ADULT SURGERY PATIENTS: If you need something for pain relief you may take 1 extra strength Tylenol (acetaminophen) AND 2 Ibuprofen (200mg  each) together every 4 hours as needed for pain. (do not take these if you are allergic to them or if you have a reason you should not take them.) Typically, you may only need pain medication for 1 to 3 days.

## 2020-09-28 ENCOUNTER — Encounter: Payer: Self-pay | Admitting: Dermatology

## 2020-09-29 LAB — ANATOMIC PATHOLOGY REPORT

## 2020-10-04 ENCOUNTER — Telehealth: Payer: Self-pay

## 2020-10-04 NOTE — Progress Notes (Signed)
Diagnosis synopsis: Comment  Comment: Specimen 1-Skin Biopsy, Left Inframammary: ACROCHORDON  (FIBROEPITHELIAL POLYP).   Skin tag, benign, no additional treatment needed.    Specimen 2-Skin Biopsy, Left Axilla: ACROCHORDON  (FIBROEPITHELIAL POLYP).   Skin tag, benign, no additional treatment needed.   MAs please call

## 2020-10-04 NOTE — Telephone Encounter (Signed)
Patient advised bx's benign skin tags, JS

## 2020-10-04 NOTE — Telephone Encounter (Signed)
-----   Message from Alfonso Patten, MD sent at 10/04/2020 10:44 AM EDT ----- Diagnosis synopsis: Comment  Comment: Specimen 1-Skin Biopsy, Left Inframammary: ACROCHORDON  (FIBROEPITHELIAL POLYP).   Skin tag, benign, no additional treatment needed.    Specimen 2-Skin Biopsy, Left Axilla: ACROCHORDON  (FIBROEPITHELIAL POLYP).   Skin tag, benign, no additional treatment needed.   MAs please call

## 2020-11-10 ENCOUNTER — Other Ambulatory Visit: Payer: Self-pay | Admitting: Family Medicine

## 2020-11-10 DIAGNOSIS — Z1231 Encounter for screening mammogram for malignant neoplasm of breast: Secondary | ICD-10-CM

## 2020-12-13 ENCOUNTER — Emergency Department
Admission: EM | Admit: 2020-12-13 | Discharge: 2020-12-13 | Disposition: A | Discharge status: Home / Self Care | Payer: Managed Care, Other (non HMO) | Attending: Emergency Medicine | Admitting: Emergency Medicine

## 2020-12-13 ENCOUNTER — Ambulatory Visit
Admission: RE | Admit: 2020-12-13 | Discharge: 2020-12-13 | Disposition: A | Discharge status: Home / Self Care | Payer: Managed Care, Other (non HMO) | Source: Ambulatory Visit | Attending: Family Medicine | Admitting: Family Medicine

## 2020-12-13 ENCOUNTER — Other Ambulatory Visit: Payer: Self-pay

## 2020-12-13 DIAGNOSIS — Z79899 Other long term (current) drug therapy: Secondary | ICD-10-CM | POA: Diagnosis not present

## 2020-12-13 DIAGNOSIS — Z1231 Encounter for screening mammogram for malignant neoplasm of breast: Secondary | ICD-10-CM | POA: Insufficient documentation

## 2020-12-13 DIAGNOSIS — R040 Epistaxis: Secondary | ICD-10-CM | POA: Insufficient documentation

## 2020-12-13 MED ORDER — DOXYCYCLINE HYCLATE 100 MG PO TABS: 100.0000 mg | ORAL_TABLET | Freq: Two times a day (BID) | ORAL | 0 refills | Status: DC

## 2020-12-13 MED ORDER — PHENYLEPHRINE HCL 10 % OP SOLN
Freq: Once | OPHTHALMIC | Status: AC
  Filled 2020-12-13: qty 10

## 2020-12-13 MED ORDER — TRANEXAMIC ACID FOR EPISTAXIS
1000.0000 mg | Freq: Once | TOPICAL | Status: AC
  Administered 2020-12-13: 1000 mg via TOPICAL
  Filled 2020-12-13: qty 10

## 2020-12-13 MED ORDER — HYDROCODONE-ACETAMINOPHEN 5-325 MG PO TABS: 1.0000 | ORAL_TABLET | ORAL | 0 refills | Status: DC | PRN

## 2020-12-13 MED ORDER — DROPERIDOL 2.5 MG/ML IJ SOLN
2.5000 mg | Freq: Once | INTRAMUSCULAR | Status: AC
  Administered 2020-12-13: 2.5 mg via INTRAMUSCULAR
  Filled 2020-12-13: qty 2

## 2020-12-13 MED ORDER — AMLODIPINE BESYLATE 5 MG PO TABS: 5.0000 mg | ORAL_TABLET | Freq: Every day | ORAL | 0 refills | Status: DC

## 2020-12-13 MED ORDER — OXYMETAZOLINE HCL 0.05 % NA SOLN
1.0000 | Freq: Once | NASAL | Status: AC
  Administered 2020-12-13: 1 via NASAL
  Filled 2020-12-13: qty 30

## 2020-12-13 MED ORDER — OXYMETAZOLINE HCL 0.05 % NA SOLN: 2.0000 | Freq: Two times a day (BID) | NASAL | 2 refills | Status: AC | PRN

## 2020-12-13 NOTE — ED Provider Notes (Signed)
[facility] Emergency Department Provider Note  ____________________________________________  Time seen: Approximately 4:53 PM  I have reviewed the triage vital signs and the nursing notes.   HISTORY  Chief Complaint Epistaxis    HPI Felicia Ellis is a 50 y.o. female who presents to the emergency department complaining of epistaxis.  Patient had nontraumatic epistaxis beginning last night.  Patient states that she was able to eventually control the bleeding with direct pressure to her nose.  Patient states that she had a resumption of bleeding today.  She was getting into her car when it started to bleed.  Again she was able to control the bleeding with direct pressure.  On the second nosebleed she presented to the emergency department for evaluation.  While here, patient started to have reportedly significant epistaxis again.  No reported trauma.  Patient has no bleeding or clotting disorders.  She does not take any anticoagulants.  She does not have any liver disease.  Has not take any medication for this.  No recent cold-like symptoms.  Patient currently has nasal clamp in place with no bleed around.         Past Medical History:  Diagnosis Date  . Anxiety   . Complication of anesthesia    migraines  . Dyspnea   . Elevated liver enzymes   . Fatty liver   . Knee pain   . Migraines   . PONV (postoperative nausea and vomiting)   . Seasonal allergies     There are no problems to display for this patient.   Past Surgical History:  Procedure Laterality Date  . ABDOMINAL HYSTERECTOMY    . BLADDER REPAIR    . CESAREAN SECTION    . KNEE ARTHROSCOPY Right 10/14/2019   Procedure: KNEE ARTHROSCOPY WITH DEBRIDEMENT, POSSIBLE EXCISION OF A PLICA, AND POSSIBLE LATERAL RELEASE;  Surgeon: Corky Mull, MD;  Location: [facility] ORS;  Service: Orthopedics;  Laterality: Right;  . KNEE ARTHROSCOPY WITH LATERAL RELEASE Left 06/11/2019   Procedure: left knee arthroscopy  with lateral retinacular release, abrasion of patella and medial chondyle, debridement of plica;  Surgeon: Corky Mull, MD;  Location: [facility] ORS;  Service: Orthopedics;  Laterality: Left;    Prior to Admission medications   Medication Sig Start Date End Date Taking? Authorizing Provider  amLODipine (NORVASC) 5 MG tablet Take 1 tablet (5 mg total) by mouth daily. 12/13/20 12/13/21 Yes Abrham Maslowski, Charline Bills, PA-C  doxycycline (VIBRA-TABS) 100 MG tablet Take 1 tablet (100 mg total) by mouth 2 (two) times daily. 12/13/20  Yes Tesla Bochicchio, Charline Bills, PA-C  HYDROcodone-acetaminophen (NORCO/VICODIN) 5-325 MG tablet Take 1 tablet by mouth every 4 (four) hours as needed for moderate pain. 12/13/20  Yes Herberto Ledwell, Charline Bills, PA-C  oxymetazoline (AFRIN) 0.05 % nasal spray Place 2 sprays into right nostril 2 (two) times daily as needed (Epistaxis). 12/13/20 12/13/21 Yes Paras Kreider, Charline Bills, PA-C  cetirizine (ZYRTEC) 10 MG tablet Take 10 mg by mouth at bedtime.    [provider]  CONTRAVE 8-90 MG TB12 Take 1 tablet by mouth 2 (two) times daily. Patient not taking: Reported on 09/21/2020 09/22/19   [provider]  etodolac (LODINE) 500 MG tablet Take 1 tablet by mouth 2 (two) times daily. 12/11/19   [provider]  ketorolac (TORADOL) 10 MG tablet Take 1 tablet (10 mg total) by mouth every 6 (six) hours. 10/14/19   Poggi, Marshall Cork, MD  meclizine (ANTIVERT) 25 MG tablet Take by mouth. 03/23/20  [provider]  venlafaxine XR (EFFEXOR-XR) 75 MG 24 hr capsule Take 150 mg by mouth every evening.    [provider]    Allergies Metronidazole, Other, Rizatriptan, Sumatriptan succinate, Codeine, Morphine and related, and Penicillins  Family History  Problem Relation Age of Onset  . Breast cancer Neg Hx     Social History Social History   Tobacco Use  . Smoking status: Never Smoker  . Smokeless tobacco: Never Used  Vaping Use  . Vaping Use: Never used  Substance  Use Topics  . Alcohol use: Not Currently  . Drug use: Never     Review of Systems  Constitutional: No fever/chills Eyes: No visual changes. No discharge ENT: Positive for epistaxis.  Currently with nasal clamp. Cardiovascular: no chest pain. Respiratory: no cough. No SOB. Gastrointestinal: No abdominal pain.  No nausea, no vomiting.  No diarrhea.  No constipation. Musculoskeletal: Negative for musculoskeletal pain. Skin: Negative for rash, abrasions, lacerations, ecchymosis. Neurological: Negative for headaches, focal weakness or numbness.  10 System ROS otherwise negative.  ____________________________________________   PHYSICAL EXAM:  VITAL SIGNS: ED Triage Vitals  Enc Vitals Group     BP 12/13/20 1601 (!) 153/77     Pulse Rate 12/13/20 1601 83     Resp 12/13/20 1601 18     Temp 12/13/20 1601 98.9 F (37.2 C)     Temp src --      SpO2 12/13/20 1601 100 %     Weight --      Height --      Head Circumference --      Peak Flow --      Pain Score 12/13/20 1600 0     Pain Loc --      Pain Edu? --      Excl. in [place]? --      Constitutional: Alert and oriented. Well appearing and in no acute distress. Eyes: Conjunctivae are normal. PERRL. EOMI. Head: Atraumatic. ENT:      Ears:       Nose: No congestion/rhinnorhea.  Epistaxis identified.  Significant ongoing bleeding with clots identified.  Patient is having both postnasal bleeding as well as frank bleeding from the bilateral nares.  Nasal clamp in place currently.  No obvious external trauma.      Mouth/Throat: Mucous membranes are moist.  Neck: No stridor.    Cardiovascular: Normal rate, regular rhythm. Normal S1 and S2.  Good peripheral circulation. Respiratory: Normal respiratory effort without tachypnea or retractions. Lungs CTAB. Good air entry to the bases with no decreased or absent breath sounds. Musculoskeletal: Full range of motion to all extremities. No gross deformities appreciated. Neurologic:  Normal  speech and language. No gross focal neurologic deficits are appreciated.  Skin:  Skin is warm, dry and intact. No rash noted. Psychiatric: Mood and affect are normal. Speech and behavior are normal. Patient exhibits appropriate insight and judgement.   ____________________________________________   LABS (all labs ordered are listed, but only abnormal results are displayed)  Labs Reviewed - No data to display ____________________________________________  EKG   ____________________________________________  RADIOLOGY  ____________________________________________    PROCEDURES  Procedure(s) performed:    .Epistaxis Management  Date/Time: 12/13/2020 8:27 PM Performed by: Darletta Moll, PA-C Authorized by: Darletta Moll, PA-C   Consent:    Consent obtained:  Verbal   Consent given by:  Patient   Risks discussed:  Bleeding, nasal injury and pain Universal protocol:    Procedure explained and questions answered to  patient or proxy's satisfaction: yes     Patient identity confirmed:  Verbally with patient Anesthesia:    Anesthesia method:  Topical application   Topical anesthesia: lidocaine/phenylephrine soln. Procedure details:    Treatment site:  L posterior   Treatment method:  Merocel sponge and nasal balloon   Treatment episode: recurring   Post-procedure details:    Assessment:  Bleeding decreased   Procedure completion:  Tolerated with difficulty Comments:     Attempt at placing Rhino Rocket in the left nares.  Met significant resistance and was unable to fully insert.  Even with inflation, TXA patient had significant bleeding around the [employer].  Attending provider, Dr. Tamala Julian also evaluated the patient, we removed the [facility] as it had not been fully placed and placed a Merocel sponge.  This was impregnated with TXA.  Patient has had some mild bloody drainage around the Merocel sponge.      Medications  lidocaine 4% (40m) with  phenylEPHRINE 10% (0.573m topical solution ( Nasal Given by Other 12/13/20 1859)  oxymetazoline (AFRIN) 0.05 % nasal spray 1 spray (1 spray Each Nare Given by Other 12/13/20 1901)  tranexamic acid (CYKLOKAPRON) 1000 MG/10ML topical solution 1,000 mg (1,000 mg Topical Given by Other 12/13/20 1859)  droperidol (INAPSINE) 2.5 MG/ML injection 2.5 mg (2.5 mg Intramuscular Given 12/13/20 1857)     ____________________________________________   INITIAL IMPRESSION / ASSESSMENT AND PLAN / ED COURSE  Pertinent labs & imaging results that were available during my care of the patient were reviewed by me and considered in my medical decision making (see chart for details).  Review of the [place] CSRS was performed in accordance of the [place] to dispensing any controlled drugs.           Patient's diagnosis is consistent with epistaxis.  Patient presented to the emergency department with heavy epistaxis out of the left nares.  Afrin and lidocaine phenylephrine mixture was attempted to slow epistaxis.  Nasal clamp and been applied.  This did not seem to improve epistaxis.  1 attempt with Rhino Rocket was performed but met resistance and full Rhino Rocket was unable to be placed.  [facility] had attending provider, Dr. SmTamala Julianome into the room to evaluate as well.  This time we tried the Merocel sponge with information with TXA.  Majority of sponge was placed but patient did have a slight amount of bleeding in the postnasal/nasopharynx as well as some mild oozing around the dressing.  As this Persisted, I discussed the patient with on-call ENT.  At this time Dr. McTami Ribasho was on-call for ENT felt that this would likely slow.  Patient is to sleep in a climbing/sitting up position, use ice over the bridge of the nose, And antibiotic given the packing placement and have pain and blood pressure control if needed..  Patient is agreeable to this plan but has return  precautions to return for any significant bleeding around the sponge, bleeding out of the left nares or significant postnasal bleeding.  Otherwise follow-up with ENT in 5 days for packing removal.  Patient is given ED precautions to return to the ED for any worsening or new symptoms.     ____________________________________________  FINAL CLINICAL IMPRESSION(S) / ED DIAGNOSES  Final diagnoses:  Epistaxis      NEW MEDICATIONS STARTED DURING THIS VISIT:  ED Discharge Orders         Ordered    doxycycline (VIBRA-TABS)  100 MG tablet  2 times daily        12/13/20 1948    amLODipine (NORVASC) 5 MG tablet  Daily        12/13/20 1948    HYDROcodone-acetaminophen (NORCO/VICODIN) 5-325 MG tablet  Every 4 hours PRN        12/13/20 1948    oxymetazoline (AFRIN) 0.05 % nasal spray  2 times daily PRN        12/13/20 1948              This chart was dictated using voice recognition software/Dragon. Despite best efforts to proofread, errors can occur which can change the meaning. Any change was purely unintentional.     Brynda Peon 12/13/20 2031    Nance Pear, MD 12/13/20 2106

## 2020-12-13 NOTE — ED Notes (Signed)
Pt states that she may still be bleeding, provider aware and going to bedside.

## 2020-12-13 NOTE — ED Triage Notes (Signed)
Pt comes with c/o nose bleed. Pt states this started yesterday but eventually stopped. Pt states today she was on way to appt and it started again. Pt denies any hx or blood thinners. Pt has nose clamp in place.

## 2020-12-14 NOTE — ED Provider Notes (Signed)
  Called to bedside by Betha Loa, PA, due to difficulty managing anterior epistaxis on the left side. I place Merocel sponge on the left, impregnated with TXA, with successful hemostasis. No evidence of residual posterior bleed. Management otherwise per his full note.   Marland KitchenEpistaxis Management  Date/Time: 12/14/2020 3:28 PM Performed by: Vladimir Crofts, MD Authorized by: Vladimir Crofts, MD   Consent:    Consent obtained:  Verbal   Consent given by:  Patient   Risks discussed:  Pain and bleeding Anesthesia:    Anesthesia method:  None Procedure details:    Treatment method:  Merocel sponge   Treatment complexity:  Limited Post-procedure details:    Assessment:  Bleeding stopped   Procedure completion:  Tolerated well, no immediate complications      Vladimir Crofts, MD 12/14/20 1529

## 2021-09-28 ENCOUNTER — Ambulatory Visit (INDEPENDENT_AMBULATORY_CARE_PROVIDER_SITE_OTHER): Payer: Managed Care, Other (non HMO) | Admitting: Dermatology

## 2021-09-28 ENCOUNTER — Other Ambulatory Visit: Payer: Self-pay

## 2021-09-28 DIAGNOSIS — B078 Other viral warts: Secondary | ICD-10-CM | POA: Diagnosis not present

## 2021-09-28 DIAGNOSIS — L821 Other seborrheic keratosis: Secondary | ICD-10-CM

## 2021-09-28 DIAGNOSIS — L814 Other melanin hyperpigmentation: Secondary | ICD-10-CM

## 2021-09-28 DIAGNOSIS — D1800 Hemangioma unspecified site: Secondary | ICD-10-CM

## 2021-09-28 DIAGNOSIS — D485 Neoplasm of uncertain behavior of skin: Secondary | ICD-10-CM | POA: Diagnosis not present

## 2021-09-28 DIAGNOSIS — Z85828 Personal history of other malignant neoplasm of skin: Secondary | ICD-10-CM | POA: Diagnosis not present

## 2021-09-28 DIAGNOSIS — Z1283 Encounter for screening for malignant neoplasm of skin: Secondary | ICD-10-CM

## 2021-09-28 DIAGNOSIS — L820 Inflamed seborrheic keratosis: Secondary | ICD-10-CM

## 2021-09-28 DIAGNOSIS — L739 Follicular disorder, unspecified: Secondary | ICD-10-CM | POA: Diagnosis not present

## 2021-09-28 DIAGNOSIS — L578 Other skin changes due to chronic exposure to nonionizing radiation: Secondary | ICD-10-CM

## 2021-09-28 DIAGNOSIS — D229 Melanocytic nevi, unspecified: Secondary | ICD-10-CM

## 2021-09-28 NOTE — Patient Instructions (Addendum)
Cryotherapy Aftercare  Wash gently with soap and water everyday.   Apply Vaseline and Band-Aid daily until healed.   Prior to procedure, discussed risks of blister formation, small wound, skin dyspigmentation, or rare scar following cryotherapy. Recommend Vaseline ointment to treated areas while healing.  Instructions for After In-Office Application of Cantharidin  1. This is a strong medicine; please follow ALL instructions.  2. Gently wash off with soap and water in four hours or sooner s directed by your physician.  3. **WARNING** this medicine can cause severe blistering, blood blisters, infection, and/or scarring if it is not washed off as directed.  4. Your progress will be rechecked in 1-2 months; call sooner if there are any questions or problems.  Cantharidin Plus is a blistering agent that comes from a beetle.  It needs to be washed off in about 4 hours after application.  Although it is painless when applied in office, it may cause symptoms of mild pain and burning several hours later.  Treated areas will swell and turn red, and blisters may form.  Vaseline and a bandaid may be applied until wound has healed.  Once healed, the skin may remain temporarily discolored.  It can take weeks to months for pigmentation to return to normal.  Advised to wash off with soap and water in 4 hours or sooner if it becomes tender before then.  Wound Care Instructions  Cleanse wound gently with soap and water once a day then pat dry with clean gauze. Apply a thing coat of Petrolatum (petroleum jelly, "Vaseline") over the wound (unless you have an allergy to this). We recommend that you use a new, sterile tube of Vaseline. Do not pick or remove scabs. Do not remove the yellow or white "healing tissue" from the base of the wound.  Cover the wound with fresh, clean, nonstick gauze and secure with paper tape. You may use Band-Aids in place of gauze and tape if the would is small enough, but would  recommend trimming much of the tape off as there is often too much. Sometimes Band-Aids can irritate the skin.  You should call the office for your biopsy report after 1 week if you have not already been contacted.  If you experience any problems, such as abnormal amounts of bleeding, swelling, significant bruising, significant pain, or evidence of infection, please call the office immediately.  FOR ADULT SURGERY PATIENTS: If you need something for pain relief you may take 1 extra strength Tylenol (acetaminophen) AND 2 Ibuprofen (200mg  each) together every 4 hours as needed for pain. (do not take these if you are allergic to them or if you have a reason you should not take them.) Typically, you may only need pain medication for 1 to 3 days.    Recommend taking Heliocare sun protection supplement daily in sunny weather for additional sun protection. For maximum protection on the sunniest days, you can take up to 2 capsules of regular Heliocare OR take 1 capsule of Heliocare Ultra. For prolonged exposure (such as a full day in the sun), you can repeat your dose of the supplement 4 hours after your first dose. Heliocare can be purchased at [facility] or at [email or web address].     Melanoma ABCDEs  Melanoma is the most dangerous type of skin cancer, and is the leading cause of death from skin disease.  You are more likely to develop melanoma if you: Have light-colored skin, light-colored eyes, or red or blond hair Spend a lot of  time in the sun Tan regularly, either outdoors or in a tanning bed Have had blistering sunburns, especially during childhood Have a close family member who has had a melanoma Have atypical moles or large birthmarks  Early detection of melanoma is key since treatment is typically straightforward and cure rates are extremely high if we catch it early.   The first sign of melanoma is often a change in a mole or a new dark spot.  The ABCDE system is a way of  remembering the signs of melanoma.  A for asymmetry:  The two halves do not match. B for border:  The edges of the growth are irregular. C for color:  A mixture of colors are present instead of an even brown color. D for diameter:  Melanomas are usually (but not always) greater than 76mm - the size of a pencil eraser. E for evolution:  The spot keeps changing in size, shape, and color.  Please check your skin once per month between visits. You can use a small mirror in front and a large mirror behind you to keep an eye on the back side or your body.   If you see any new or changing lesions before your next follow-up, please call to schedule a visit.  Please continue daily skin protection including broad spectrum sunscreen SPF 30+ to sun-exposed areas, reapplying every 2 hours as needed when you're outdoors.    If you have any questions or concerns for your doctor, please call our main line at [phone] and press option 4 to reach your doctor's medical assistant. If no one answers, please leave a voicemail as directed and we will return your call as soon as possible. Messages left after 4 pm will be answered the following business day.   You may also send [place] a message via [place]. We typically respond to MyChart messages within 1-2 business days.  For prescription refills, please ask your pharmacy to contact our office. Our fax number is [phone].  If you have an urgent issue when the clinic is closed that cannot wait until the next business day, you can page your doctor at the number below.    Please note that while we do our best to be available for urgent issues outside of office hours, we are not available 24/7.   If you have an urgent issue and are unable to reach [place], you may choose to seek medical care at your doctor's office, retail clinic, urgent care center, or emergency room.  If you have a medical emergency, please immediately call 911 or go to the emergency  department.  Pager Numbers  - Dr. Nehemiah Massed: [phone]  - Dr. Laurence Ferrari: [phone]  - Dr. Nicole Kindred: [phone]  In the event of inclement weather, please call our main line at [phone] for an update on the status of any delays or closures.  Dermatology Medication Tips: Please keep the boxes that topical medications come in in order to help keep track of the instructions about where and how to use these. Pharmacies typically print the medication instructions only on the boxes and not directly on the medication tubes.   If your medication is too expensive, please contact our office at [phone] option 4 or send [place] a message through [place].   We are unable to tell what your co-pay for medications will be in advance as this is different depending on your insurance coverage. However, we may be able to find a substitute medication at lower cost or fill  out paperwork to get insurance to cover a needed medication.   If a prior authorization is required to get your medication covered by your insurance company, please allow [place] 1-2 business days to complete this process.  Drug prices often vary depending on where the prescription is filled and some pharmacies may offer cheaper prices.  The website www.goodrx.com contains coupons for medications through different pharmacies. The prices here do not account for what the cost may be with help from insurance (it may be cheaper with your insurance), but the website can give you the price if you did not use any insurance.  - You can print the associated coupon and take it with your prescription to the pharmacy.  - You may also stop by our office during regular business hours and pick up a GoodRx coupon card.  - If you need your prescription sent electronically to a different pharmacy, notify our office through [facility] or by phone at [phone] option 4.

## 2021-09-28 NOTE — Progress Notes (Signed)
Follow-Up Visit   Subjective  Felicia Ellis is a 50 y.o. female who presents for the following: FBSE (Patient here for full body skin exam and skin cancer screening. Patient does not have a hx of skin cancer or abnormal moles. Patient does have some spots that have gotten larger at right inframammary, neck, axilla and eyelid. ), Warts (Present for > 1 year at left hand, painful if she bumps it.), and Rash (Itchy rash at left hand, present for a few weeks. ).   The following portions of the chart were reviewed this encounter and updated as appropriate:   Tobacco  Allergies  Meds  Problems  Med Hx  Surg Hx  Fam Hx      Review of Systems:  No other skin or systemic complaints except as noted in HPI or Assessment and Plan.  Objective  Well appearing patient in no apparent distress; mood and affect are within normal limits.  A full examination was performed including scalp, head, eyes, ears, nose, lips, neck, chest, axillae, abdomen, back, buttocks, bilateral upper extremities, bilateral lower extremities, hands, feet, fingers, toes, fingernails, and toenails. All findings within normal limits unless otherwise noted below.  Left Dorsal Hand x 2 (2) Verrucous papules -- Discussed viral etiology and contagion.   Right Inframammary Fold R/o Irritated acrochordon vs nevus or neurofibroma 0.5 cm tan fleshy papule     Left Axilla   left neck Erythematous keratotic or waxy stuck-on papule or plaque.   Neck - Posterior Perifollicular erythematous papules and pustules    Assessment & Plan  Other viral warts (2) Left Dorsal Hand x 2  Discussed viral etiology and risk of spread.  Discussed multiple treatments may be required to clear warts.  Discussed possible post-treatment dyspigmentation and risk of recurrence.  Prior to procedure, discussed risks of blister formation, small wound, skin dyspigmentation, or rare scar following cryotherapy. Recommend Vaseline ointment to  treated areas while healing.  Cantharidin Plus is a blistering agent that comes from a beetle.  It needs to be washed off in about 4 hours after application.  Although it is painless when applied in office, it may cause symptoms of mild pain and burning several hours later.  Treated areas will swell and turn red, and blisters may form.  Vaseline and a bandaid may be applied until wound has healed.  Once healed, the skin may remain temporarily discolored.  It can take weeks to months for pigmentation to return to normal.  Advised to wash off with soap and water in 4 hours or sooner if it becomes tender before then.  Consider adding Squaric Acid at follow up if indicated.   Destruction of lesion - Left Dorsal Hand x 2  Destruction method: cryotherapy   Informed consent: discussed and consent obtained   Lesion destroyed using liquid nitrogen: Yes   Cryotherapy cycles:  2 Outcome: patient tolerated procedure well with no complications   Post-procedure details: wound care instructions given    Destruction of lesion - Left Dorsal Hand x 2  Destruction method: chemical removal   Informed consent: discussed and consent obtained   Timeout:  patient name, date of birth, surgical site, and procedure verified Chemical destruction method: cantharidin   Chemical destruction method comment:  Cantharidin plus Application time:  4 hours Procedure instructions: patient instructed to wash and dry area   Outcome: patient tolerated procedure well with no complications   Post-procedure details: wound care instructions given    Neoplasm of uncertain behavior  of skin (2) Right Inframammary Fold  Epidermal / dermal shaving  Lesion diameter (cm):  0.5 Informed consent: discussed and consent obtained   Timeout: patient name, date of birth, surgical site, and procedure verified   Patient was prepped and draped in usual sterile fashion: area prepped with isopropyl alcohol. Anesthesia: the lesion was  anesthetized in a standard fashion   Local anesthetic: 0.25% bupivicaine. Instrument used: flexible razor blade   Hemostasis achieved with: aluminum chloride   Outcome: patient tolerated procedure well   Post-procedure details: wound care instructions given   Additional details:  Mupirocin and a bandage applied  Left Axilla  Left axilla - Skin Tag vs Neurofibroma vs Nevus. Benign-appearing, monitor, call for changes.  Related Procedures Anatomic Pathology Report  Inflamed seborrheic keratosis left neck  Symptomatic  Prior to procedure, discussed risks of blister formation, small wound, skin dyspigmentation, or rare scar following cryotherapy. Recommend Vaseline ointment to treated areas while healing.   Destruction of lesion - left neck  Destruction method: cryotherapy   Informed consent: discussed and consent obtained   Lesion destroyed using liquid nitrogen: Yes   Cryotherapy cycles:  2 Outcome: patient tolerated procedure well with no complications   Post-procedure details: wound care instructions given    Folliculitis Neck - Posterior  Not bothersome for patient.   Patient deferred treatment at this time.   Lentigines - Scattered tan macules - Due to sun exposure - Benign-appearing, observe - Recommend daily broad spectrum sunscreen SPF 30+ to sun-exposed areas, reapply every 2 hours as needed. - Call for any changes  Seborrheic Keratoses - Stuck-on, waxy, tan-brown papules and/or plaques  - Benign-appearing - Discussed benign etiology and prognosis. - Observe - Call for any changes  Melanocytic Nevi - Tan-brown and/or pink-flesh-colored symmetric macules and papules - Benign appearing on exam today - Observation - Call clinic for new or changing moles - Recommend daily use of broad spectrum spf 30+ sunscreen to sun-exposed areas.   Hemangiomas - Red papules - Discussed benign nature - Observe - Call for any changes  Actinic Damage - Chronic  condition, secondary to cumulative UV/sun exposure - diffuse scaly erythematous macules with underlying dyspigmentation - Recommend daily broad spectrum sunscreen SPF 30+ to sun-exposed areas, reapply every 2 hours as needed.  - Staying in the shade or wearing long sleeves, sun glasses (UVA+UVB protection) and wide brim hats (4-inch brim around the entire circumference of the hat) are also recommended for sun protection.  - Call for new or changing lesions.  Skin cancer screening performed today.  Return for 1-2 years FBSE.  Graciella Belton, RMA, am acting as scribe for Forest Gleason, MD .  Documentation: I have reviewed the above documentation for accuracy and completeness, and I agree with the above.  Forest Gleason, MD

## 2021-10-06 LAB — ANATOMIC PATHOLOGY REPORT

## 2021-10-10 ENCOUNTER — Telehealth: Payer: Self-pay

## 2021-10-10 NOTE — Telephone Encounter (Signed)
-----   Message from Alfonso Patten, MD sent at 10/10/2021 12:14 PM EST ----- Comment: Specimen 1-Skin Biopsy, Right Inframammary: ACROCHORDON  (FIBROEPITHELIAL POLYP), INFLAMED.  -->  Skin tag, benign, no additional treatment needed.   MAs please call. Thank you!

## 2021-10-11 ENCOUNTER — Encounter: Payer: Self-pay | Admitting: Dermatology

## 2021-10-16 ENCOUNTER — Telehealth: Payer: Self-pay

## 2021-10-16 NOTE — Telephone Encounter (Signed)
-----   Message from Alfonso Patten, MD sent at 10/10/2021 12:14 PM EST ----- Comment: Specimen 1-Skin Biopsy, Right Inframammary: ACROCHORDON  (FIBROEPITHELIAL POLYP), INFLAMED.  -->  Skin tag, benign, no additional treatment needed.   MAs please call. Thank you!

## 2021-11-03 ENCOUNTER — Other Ambulatory Visit: Payer: Self-pay | Admitting: Family Medicine

## 2021-11-03 DIAGNOSIS — Z1231 Encounter for screening mammogram for malignant neoplasm of breast: Secondary | ICD-10-CM

## 2021-12-14 ENCOUNTER — Other Ambulatory Visit: Payer: Self-pay

## 2021-12-14 ENCOUNTER — Ambulatory Visit
Admission: RE | Admit: 2021-12-14 | Discharge: 2021-12-14 | Disposition: A | Discharge status: Home / Self Care | Payer: Managed Care, Other (non HMO) | Source: Ambulatory Visit | Attending: Family Medicine | Admitting: Family Medicine

## 2021-12-14 DIAGNOSIS — Z1231 Encounter for screening mammogram for malignant neoplasm of breast: Secondary | ICD-10-CM | POA: Insufficient documentation

## 2021-12-19 ENCOUNTER — Other Ambulatory Visit: Payer: Self-pay | Admitting: Family Medicine

## 2021-12-19 DIAGNOSIS — N6489 Other specified disorders of breast: Secondary | ICD-10-CM

## 2021-12-19 DIAGNOSIS — R928 Other abnormal and inconclusive findings on diagnostic imaging of breast: Secondary | ICD-10-CM

## 2021-12-26 ENCOUNTER — Ambulatory Visit
Admission: RE | Admit: 2021-12-26 | Discharge: 2021-12-26 | Disposition: A | Discharge status: Home / Self Care | Payer: Managed Care, Other (non HMO) | Source: Ambulatory Visit | Attending: Family Medicine | Admitting: Family Medicine

## 2021-12-26 ENCOUNTER — Other Ambulatory Visit: Payer: Self-pay

## 2021-12-26 DIAGNOSIS — R928 Other abnormal and inconclusive findings on diagnostic imaging of breast: Secondary | ICD-10-CM | POA: Insufficient documentation

## 2021-12-26 DIAGNOSIS — N6489 Other specified disorders of breast: Secondary | ICD-10-CM | POA: Insufficient documentation

## 2022-10-03 ENCOUNTER — Encounter: Payer: Managed Care, Other (non HMO) | Admitting: Dermatology

## 2022-10-29 IMAGING — MG DIGITAL SCREENING BILAT W/ TOMO W/ CAD
8 series · 8 of 24 positions shown · non-contrast
Comparison: Previous exam(s).

CLINICAL DATA: Screening.

EXAM:
DIGITAL SCREENING BILATERAL MAMMOGRAM WITH TOMO AND CAD

[R CC synth-2D]
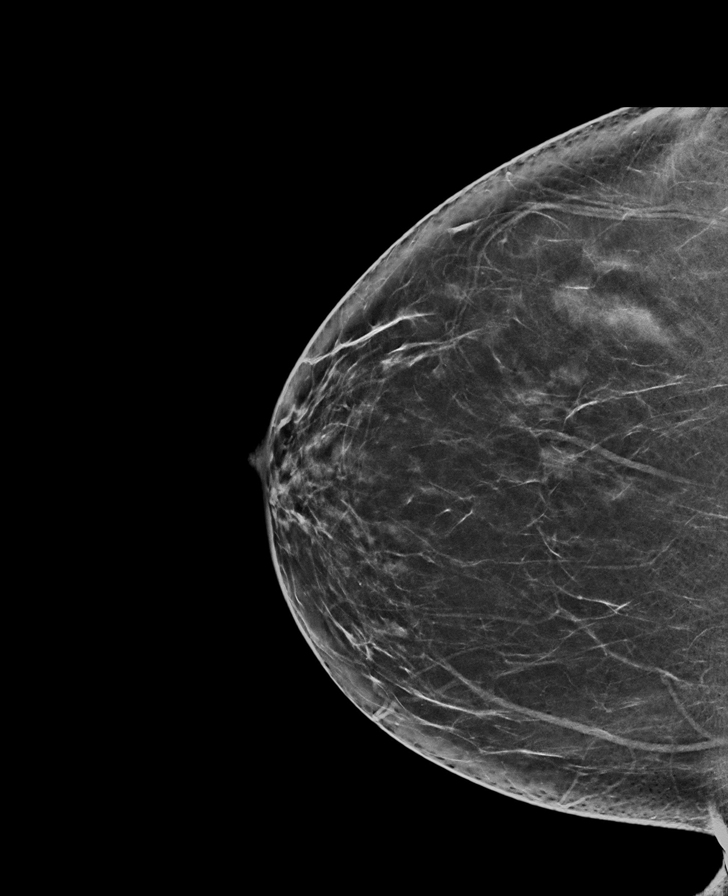

[R MLO synth-2D]
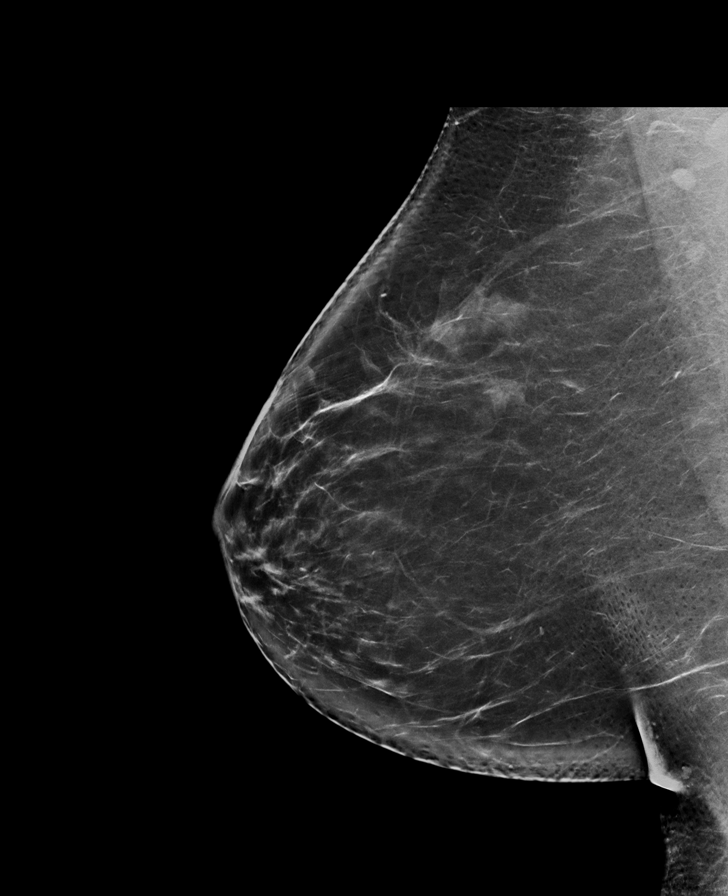

[L MLO synth-2D]
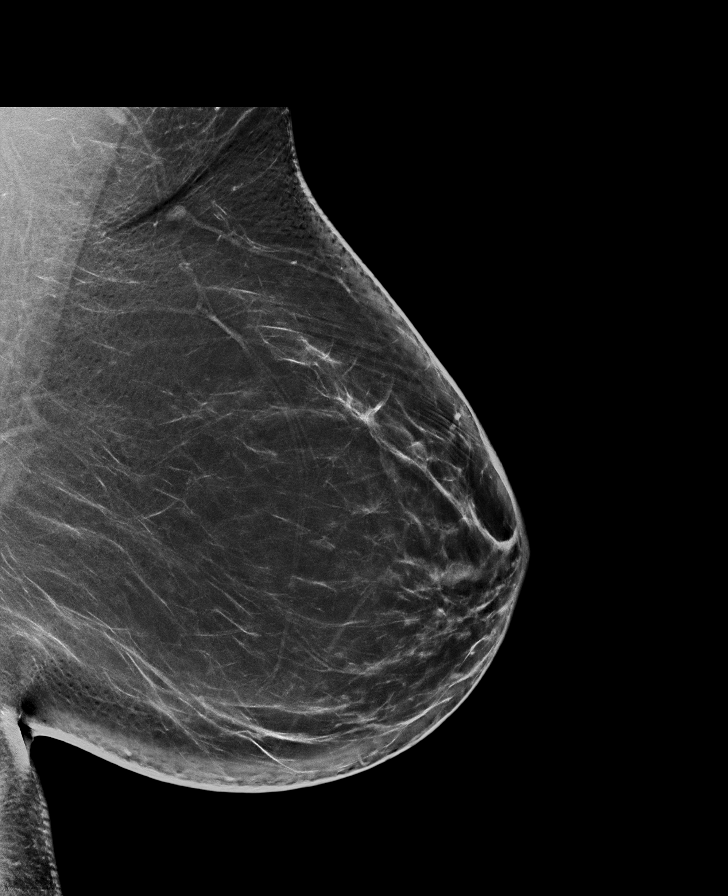

[L CC synth-2D]
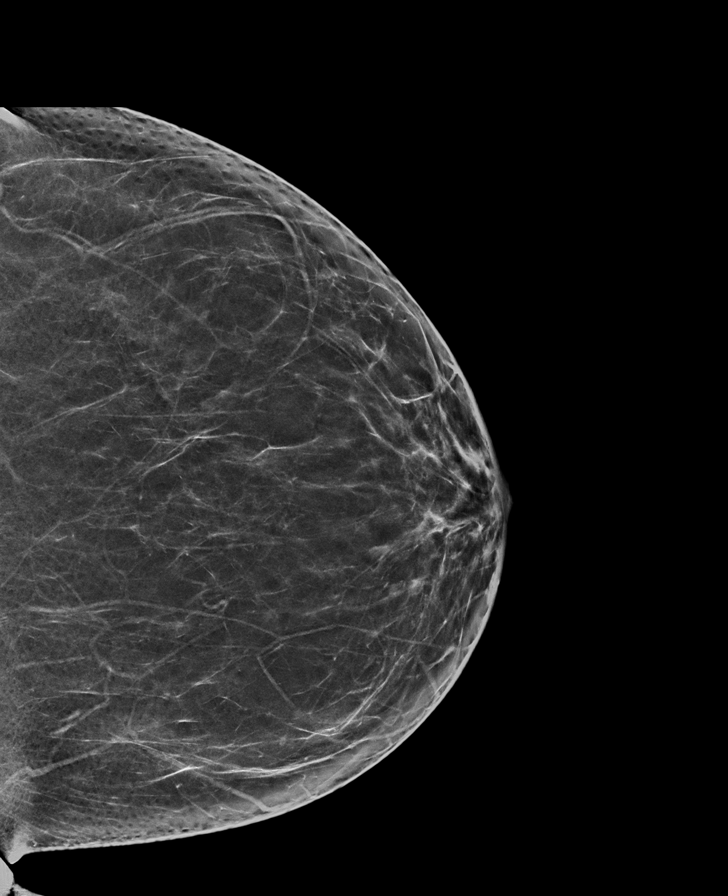

[L CC tomo · tomo slice 36/71.0]
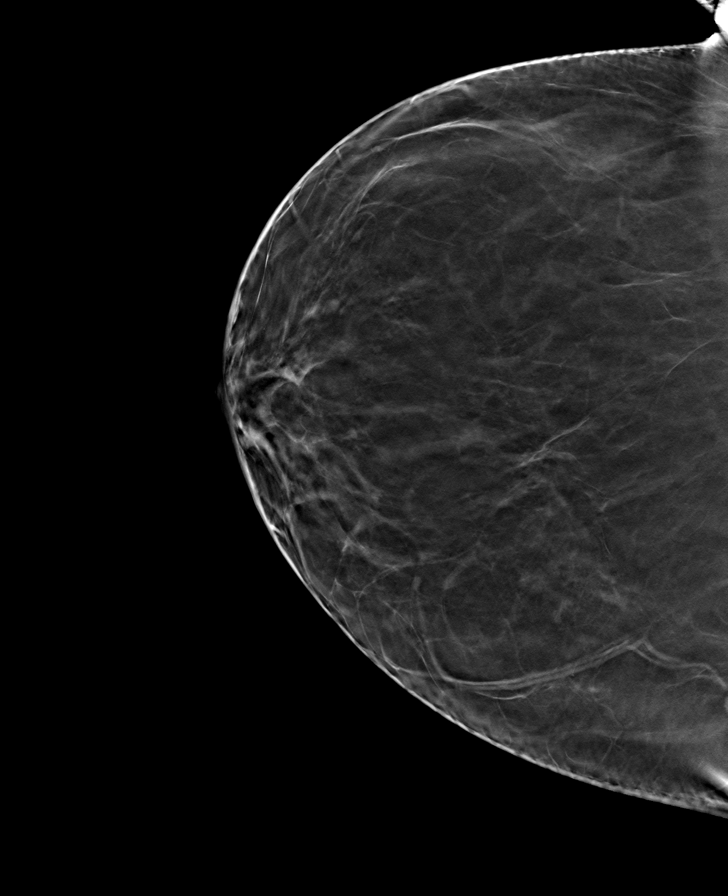

[R MLO tomo · tomo slice 48/95.0]
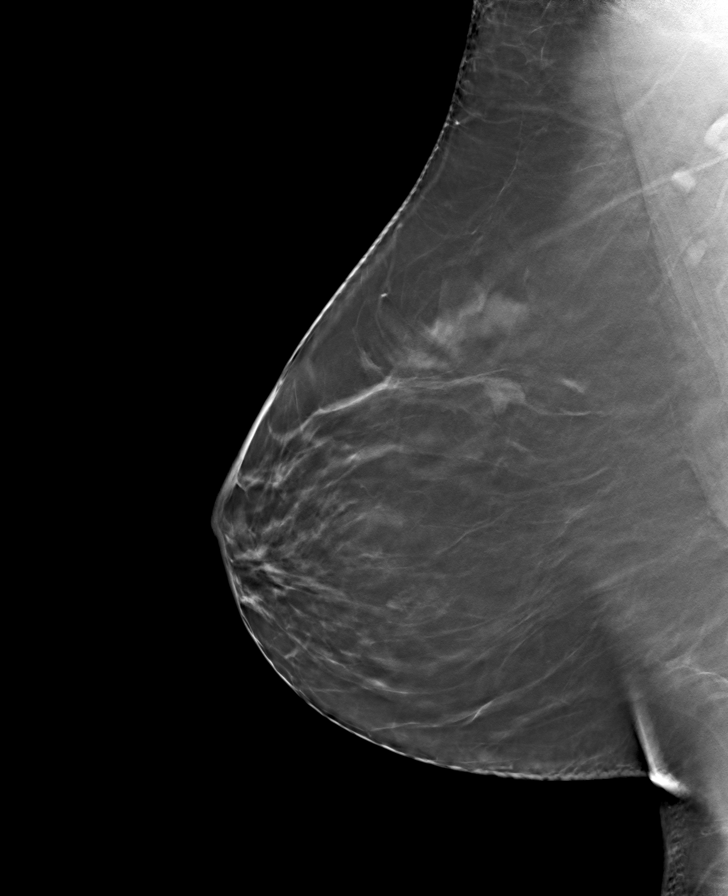

[L MLO tomo · tomo slice 46/91.0]
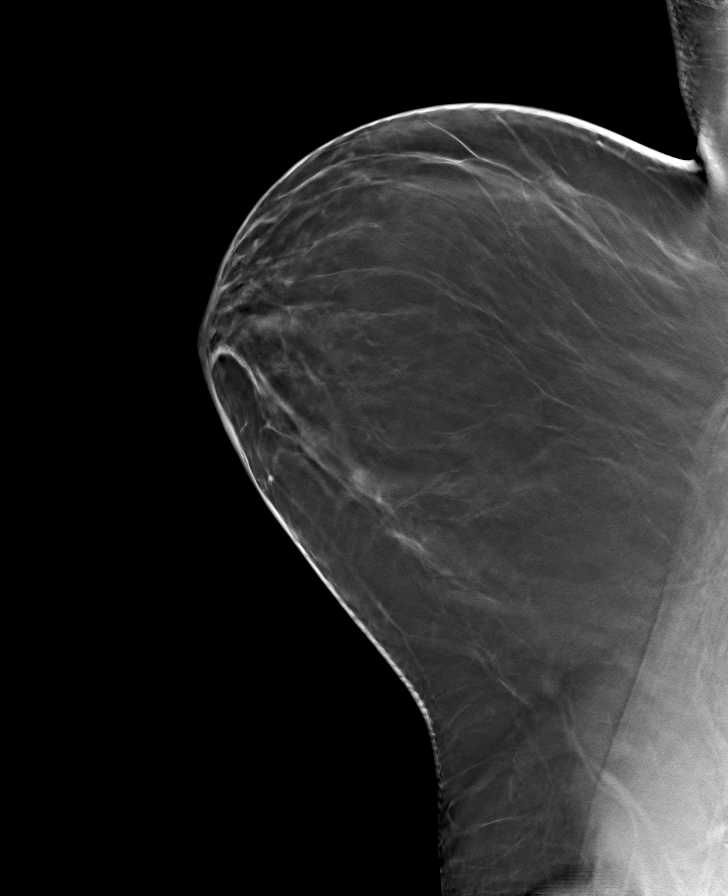

[R CC tomo · tomo slice 37/74.0]
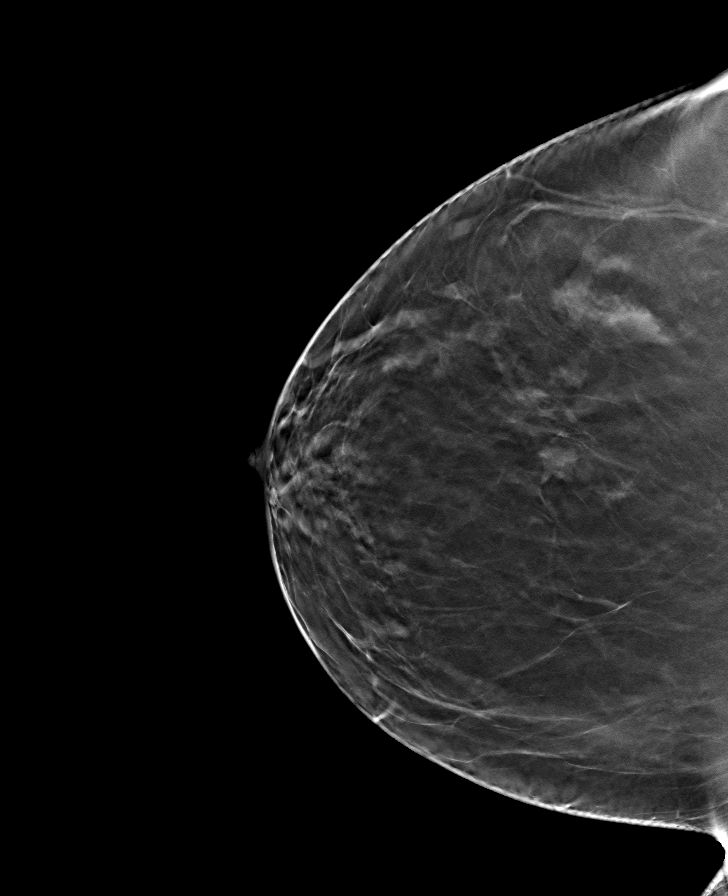

[8 of 24 positions shown; findings below may reference images not displayed]

ACR Breast Density Category b: There are scattered areas of
fibroglandular density.
FINDINGS: There are no findings suspicious for malignancy. Images were
processed with CAD.
IMPRESSION: No mammographic evidence of malignancy. A result letter of this
screening mammogram will be mailed directly to the patient.

RECOMMENDATION:
Screening mammogram in one year. (Code:[id number])

BI-RADS CATEGORY  1: Negative.

## 2022-10-31 ENCOUNTER — Other Ambulatory Visit: Payer: Self-pay | Admitting: Family Medicine

## 2022-10-31 DIAGNOSIS — Z1231 Encounter for screening mammogram for malignant neoplasm of breast: Secondary | ICD-10-CM

## 2022-12-12 ENCOUNTER — Encounter: Payer: Managed Care, Other (non HMO) | Admitting: Dermatology

## 2022-12-20 ENCOUNTER — Ambulatory Visit
Admission: RE | Admit: 2022-12-20 | Discharge: 2022-12-20 | Disposition: A | Discharge status: Home / Self Care | Payer: Managed Care, Other (non HMO) | Source: Ambulatory Visit | Attending: Family Medicine | Admitting: Family Medicine

## 2022-12-20 DIAGNOSIS — Z1231 Encounter for screening mammogram for malignant neoplasm of breast: Secondary | ICD-10-CM | POA: Insufficient documentation

## 2023-02-07 ENCOUNTER — Other Ambulatory Visit: Payer: Self-pay | Admitting: Surgery

## 2023-02-12 ENCOUNTER — Encounter
Admission: RE | Admit: 2023-02-12 | Discharge: 2023-02-12 | Disposition: A | Discharge status: Home / Self Care | Payer: Managed Care, Other (non HMO) | Source: Ambulatory Visit | Attending: Surgery | Admitting: Surgery

## 2023-02-12 ENCOUNTER — Other Ambulatory Visit: Payer: Self-pay

## 2023-02-12 HISTORY — DX: Acute eczematoid otitis externa, bilateral: H60.543

## 2023-02-12 HISTORY — DX: Unspecified osteoarthritis, unspecified site: M19.90

## 2023-02-12 HISTORY — DX: Anemia, unspecified: D64.9

## 2023-02-12 HISTORY — DX: Hyperlipidemia, unspecified: E78.5

## 2023-02-12 NOTE — Patient Instructions (Addendum)
Your procedure is scheduled on: 02/21/23 - Thursday Report to the Registration Desk on the 1st floor of the [facility]. To find out your arrival time, please call [phone] between 1PM - 3PM on: 02/20/23 - Wednesday If your arrival time is 6:00 am, do not arrive before that time as the [facility] entrance doors do not open until 6:00 am.  REMEMBER: Instructions that are not followed completely may result in serious medical risk, up to and including death; or upon the discretion of your surgeon and anesthesiologist your surgery may need to be rescheduled.  Do not eat food after midnight the night before surgery.  No gum chewing or hard candies.  You may however, drink CLEAR liquids up to 2 hours before you are scheduled to arrive for your surgery. Do not drink anything within 2 hours of your scheduled arrival time.  Clear liquids include: - water  - apple juice without pulp - gatorade (not RED colors) - black coffee or tea (Do NOT add milk or creamers to the coffee or tea) Do NOT drink anything that is not on this list.  In addition, your doctor has ordered for you to drink the provided:  Ensure Pre-Surgery Clear Carbohydrate Drink  Drinking this carbohydrate drink up to two hours before surgery helps to reduce insulin resistance and improve patient outcomes. Please complete drinking 2 hours before scheduled arrival time.  One week prior to surgery: Stop Anti-inflammatories (NSAIDS) such as Advil, Aleve, Ibuprofen, Motrin, Naproxen, Naprosyn and Aspirin based products such as Excedrin, Goody's Powder, BC Powder.  Stop ANY OVER THE COUNTER supplements until after surgery.  You may however, continue to take Tylenol if needed for pain up until the day of surgery.  TAKE ONLY THESE MEDICATIONS THE MORNING OF SURGERY WITH A SIP OF WATER:  venlafaxine XR (EFFEXOR-XR)  meclizine (ANTIVERT) if needed    - STOP taking etodolac (LODINE) beginning 02/14/23.  - STOP taking  ketorolac (TORADOL) beginning 02/14/23.  No Alcohol for 24 hours before or after surgery.  No Smoking including e-cigarettes for 24 hours before surgery.  No chewable tobacco products for at least 6 hours before surgery.  No nicotine patches on the day of surgery.  Do not use any "recreational" drugs for at least a week (preferably 2 weeks) before your surgery.  Please be advised that the combination of cocaine and anesthesia may have negative outcomes, up to and including death. If you test positive for cocaine, your surgery will be cancelled.  On the morning of surgery brush your teeth with toothpaste and water, you may rinse your mouth with mouthwash if you wish. Do not swallow any toothpaste or mouthwash.  Use CHG Soap or wipes as directed on instruction sheet.  Do not wear jewelry, make-up, hairpins, clips or nail polish.  Do not wear lotions, powders, or perfumes.   Do not shave body hair from the neck down 48 hours before surgery.  Contact lenses, hearing aids and dentures may not be worn into surgery.  Do not bring valuables to the hospital. [facility] is not responsible for any missing/lost belongings or valuables.   Notify your doctor if there is any change in your medical condition (cold, fever, infection).  Wear comfortable clothing (specific to your surgery type) to the hospital.  After surgery, you can help prevent lung complications by doing breathing exercises.  Take deep breaths and cough every 1-2 hours. Your doctor may order a device called an Incentive Spirometer to help you take  deep breaths. When coughing or sneezing, hold a pillow firmly against your incision with both hands. This is called "splinting." Doing this helps protect your incision. It also decreases belly discomfort.  If you are being admitted to the hospital overnight, leave your suitcase in the car. After surgery it may be brought to your room.  In case of increased patient census, it may be  necessary for you, the patient, to continue your postoperative care in the Same Day Surgery department.  If you are being discharged the day of surgery, you will not be allowed to drive home. You will need a responsible individual to drive you home and stay with you for 24 hours after surgery.   If you are taking public transportation, you will need to have a responsible individual with you.  Please call the Economy Dept. at [phone] if you have any questions about these instructions.  Surgery Visitation Policy:  Patients undergoing a surgery or procedure may have two family members or support persons with them as long as the person is not COVID-19 positive or experiencing its symptoms.   Inpatient Visitation:    Visiting hours are 7 a.m. to 8 p.m. Up to four visitors are allowed at one time in a patient room. The visitors may rotate out with other people during the day. One designated support person (adult) may remain overnight.  Due to an increase in RSV and influenza rates and associated hospitalizations, children ages 78 and under will not be able to visit patients in [facility]. Masks continue to be strongly recommended.

## 2023-02-21 ENCOUNTER — Ambulatory Visit
Admission: RE | Admit: 2023-02-21 | Discharge: 2023-02-21 | Disposition: A | Discharge status: Home / Self Care | Payer: Managed Care, Other (non HMO) | Attending: Surgery | Admitting: Surgery

## 2023-02-21 ENCOUNTER — Ambulatory Visit: Payer: Managed Care, Other (non HMO)

## 2023-02-21 ENCOUNTER — Encounter: Payer: Self-pay | Admitting: Surgery

## 2023-02-21 ENCOUNTER — Other Ambulatory Visit: Payer: Self-pay

## 2023-02-21 ENCOUNTER — Encounter
Admission: RE | Disposition: A | Discharge status: Home / Self Care | Payer: Self-pay | Source: Home / Self Care | Attending: Surgery

## 2023-02-21 ENCOUNTER — Ambulatory Visit: Payer: Managed Care, Other (non HMO) | Admitting: Urgent Care

## 2023-02-21 ENCOUNTER — Ambulatory Visit: Payer: Managed Care, Other (non HMO) | Admitting: Certified Registered"

## 2023-02-21 ENCOUNTER — Encounter: Payer: Managed Care, Other (non HMO) | Admitting: Dermatology

## 2023-02-21 DIAGNOSIS — G5602 Carpal tunnel syndrome, left upper limb: Secondary | ICD-10-CM | POA: Insufficient documentation

## 2023-02-21 DIAGNOSIS — F419 Anxiety disorder, unspecified: Secondary | ICD-10-CM | POA: Diagnosis not present

## 2023-02-21 DIAGNOSIS — M1812 Unilateral primary osteoarthritis of first carpometacarpal joint, left hand: Secondary | ICD-10-CM | POA: Insufficient documentation

## 2023-02-21 HISTORY — PX: CARPOMETACARPAL (CMC) FUSION OF THUMB: SHX6290

## 2023-02-21 HISTORY — PX: CARPAL TUNNEL RELEASE: SHX101

## 2023-02-21 SURGERY — CARPOMETACARPAL (CMC) FUSION OF THUMB
Anesthesia: General | Site: Wrist | Laterality: Left

## 2023-02-21 MED ORDER — ONDANSETRON HCL 4 MG/2ML IJ SOLN
INTRAMUSCULAR | Status: DC | PRN
  Administered 2023-02-21: 4 mg via INTRAVENOUS

## 2023-02-21 MED ORDER — CEFAZOLIN SODIUM-DEXTROSE 2-4 GM/100ML-% IV SOLN
2.0000 g | INTRAVENOUS | Status: AC
  Administered 2023-02-21: 2 g via INTRAVENOUS

## 2023-02-21 MED ORDER — FENTANYL CITRATE (PF) 100 MCG/2ML IJ SOLN
INTRAMUSCULAR | Status: AC
  Filled 2023-02-21: qty 2

## 2023-02-21 MED ORDER — CHLORHEXIDINE GLUCONATE 0.12 % MT SOLN: 15.0000 mL | Freq: Once | OROMUCOSAL | Status: AC

## 2023-02-21 MED ORDER — KETOROLAC TROMETHAMINE 30 MG/ML IJ SOLN: 30.0000 mg | Freq: Once | INTRAMUSCULAR | Status: AC

## 2023-02-21 MED ORDER — METOPROLOL TARTRATE 5 MG/5ML IV SOLN
INTRAVENOUS | Status: DC | PRN
  Administered 2023-02-21: 2 mg via INTRAVENOUS

## 2023-02-21 MED ORDER — MIDAZOLAM HCL 2 MG/2ML IJ SOLN
INTRAMUSCULAR | Status: AC
  Filled 2023-02-21: qty 2

## 2023-02-21 MED ORDER — KETOROLAC TROMETHAMINE 15 MG/ML IJ SOLN
INTRAMUSCULAR | Status: DC | PRN
  Administered 2023-02-21: 30 mg via INTRAMUSCULAR

## 2023-02-21 MED ORDER — CEFAZOLIN SODIUM-DEXTROSE 2-4 GM/100ML-% IV SOLN
INTRAVENOUS | Status: AC
  Filled 2023-02-21: qty 100

## 2023-02-21 MED ORDER — FAMOTIDINE 20 MG PO TABS: 20.0000 mg | ORAL_TABLET | Freq: Once | ORAL | Status: AC

## 2023-02-21 MED ORDER — PROPOFOL 10 MG/ML IV BOLUS
INTRAVENOUS | Status: DC | PRN
  Administered 2023-02-21: 100 mg via INTRAVENOUS
  Administered 2023-02-21: 150 ug/kg/min via INTRAVENOUS

## 2023-02-21 MED ORDER — METOCLOPRAMIDE HCL 5 MG/ML IJ SOLN: 5.0000 mg | Freq: Three times a day (TID) | INTRAMUSCULAR | Status: DC | PRN

## 2023-02-21 MED ORDER — SODIUM CHLORIDE 0.9 % IV SOLN: INTRAVENOUS | Status: DC

## 2023-02-21 MED ORDER — FAMOTIDINE 20 MG PO TABS
ORAL_TABLET | ORAL | Status: AC
  Administered 2023-02-21: 20 mg via ORAL
  Filled 2023-02-21: qty 1

## 2023-02-21 MED ORDER — BUPIVACAINE HCL (PF) 0.5 % IJ SOLN
INTRAMUSCULAR | Status: DC | PRN
  Administered 2023-02-21: 20 mL

## 2023-02-21 MED ORDER — DEXMEDETOMIDINE HCL IN NACL 80 MCG/20ML IV SOLN
INTRAVENOUS | Status: DC | PRN
  Administered 2023-02-21 (×2): 12 ug via BUCCAL

## 2023-02-21 MED ORDER — FENTANYL CITRATE (PF) 100 MCG/2ML IJ SOLN
INTRAMUSCULAR | Status: DC | PRN
  Administered 2023-02-21 (×3): 50 ug via INTRAVENOUS

## 2023-02-21 MED ORDER — ORAL CARE MOUTH RINSE: 15.0000 mL | Freq: Once | OROMUCOSAL | Status: AC

## 2023-02-21 MED ORDER — CHLORHEXIDINE GLUCONATE 0.12 % MT SOLN
OROMUCOSAL | Status: AC
  Administered 2023-02-21: 15 mL via OROMUCOSAL
  Filled 2023-02-21: qty 15

## 2023-02-21 MED ORDER — ONDANSETRON HCL 4 MG/2ML IJ SOLN: 4.0000 mg | Freq: Four times a day (QID) | INTRAMUSCULAR | Status: DC | PRN

## 2023-02-21 MED ORDER — ACETAMINOPHEN 10 MG/ML IV SOLN
INTRAVENOUS | Status: AC
  Filled 2023-02-21: qty 100

## 2023-02-21 MED ORDER — ONDANSETRON HCL 4 MG PO TABS: 4.0000 mg | ORAL_TABLET | Freq: Four times a day (QID) | ORAL | Status: DC | PRN

## 2023-02-21 MED ORDER — 0.9 % SODIUM CHLORIDE (POUR BTL) OPTIME
TOPICAL | Status: DC | PRN
  Administered 2023-02-21: 500 mL

## 2023-02-21 MED ORDER — DEXAMETHASONE SODIUM PHOSPHATE 10 MG/ML IJ SOLN
INTRAMUSCULAR | Status: DC | PRN
  Administered 2023-02-21: 10 mg via INTRAVENOUS

## 2023-02-21 MED ORDER — ACETAMINOPHEN 325 MG PO TABS: 325.0000 mg | ORAL_TABLET | Freq: Four times a day (QID) | ORAL | Status: DC | PRN

## 2023-02-21 MED ORDER — BUPIVACAINE HCL (PF) 0.5 % IJ SOLN
INTRAMUSCULAR | Status: AC
  Filled 2023-02-21: qty 30

## 2023-02-21 MED ORDER — PROPOFOL 1000 MG/100ML IV EMUL
INTRAVENOUS | Status: AC
  Filled 2023-02-21: qty 100

## 2023-02-21 MED ORDER — METOCLOPRAMIDE HCL 10 MG PO TABS: 5.0000 mg | ORAL_TABLET | Freq: Three times a day (TID) | ORAL | Status: DC | PRN

## 2023-02-21 MED ORDER — LACTATED RINGERS IV SOLN: INTRAVENOUS | Status: DC

## 2023-02-21 MED ORDER — FENTANYL CITRATE (PF) 100 MCG/2ML IJ SOLN: 25.0000 ug | INTRAMUSCULAR | Status: DC | PRN

## 2023-02-21 MED ORDER — MIDAZOLAM HCL 2 MG/2ML IJ SOLN
INTRAMUSCULAR | Status: DC | PRN
  Administered 2023-02-21: 2 mg via INTRAVENOUS

## 2023-02-21 SURGICAL SUPPLY — 46 items
APL PRP STRL LF DISP 70% ISPRP (MISCELLANEOUS) ×2
BLADE DEBAKEY 8.0 (BLADE) ×2 IMPLANT
BLADE OSC/SAGITTAL 5.5X25 (BLADE) ×2 IMPLANT
BLADE SURG 15 STRL LF DISP TIS (BLADE) ×4 IMPLANT
BLADE SURG 15 STRL SS (BLADE) ×4
BNDG CMPR 5X3 KNIT ELC UNQ LF (GAUZE/BANDAGES/DRESSINGS) ×2
BNDG CMPR 5X4 CHSV STRCH STRL (GAUZE/BANDAGES/DRESSINGS) ×2
BNDG COHESIVE 4X5 TAN STRL LF (GAUZE/BANDAGES/DRESSINGS) ×2 IMPLANT
BNDG ELASTIC 3INX 5YD STR LF (GAUZE/BANDAGES/DRESSINGS) ×2 IMPLANT
BNDG ESMARCH 4 X 12 STRL LF (GAUZE/BANDAGES/DRESSINGS) ×2
BNDG ESMARCH 4X12 STRL LF (GAUZE/BANDAGES/DRESSINGS) ×2 IMPLANT
BNDG GZE 12X3 1 PLY HI ABS (GAUZE/BANDAGES/DRESSINGS) ×2
BNDG STRETCH GAUZE 3IN X12FT (GAUZE/BANDAGES/DRESSINGS) ×2 IMPLANT
BUR 4X55 1 (BURR) ×2 IMPLANT
CHLORAPREP W/TINT 26 (MISCELLANEOUS) ×2 IMPLANT
CUFF TOURN SGL QUICK 18X4 (TOURNIQUET CUFF) ×2 IMPLANT
DRAPE FLUOR MINI C-ARM 54X84 (DRAPES) ×2 IMPLANT
FORCEPS JEWEL BIP 4-3/4 STR (INSTRUMENTS) ×2 IMPLANT
GAUZE XEROFORM 1X8 LF (GAUZE/BANDAGES/DRESSINGS) ×2 IMPLANT
GLOVE BIO SURGEON STRL SZ8 (GLOVE) ×4 IMPLANT
GLOVE INDICATOR 8.0 STRL GRN (GLOVE) ×2 IMPLANT
GOWN STRL REUS W/ TWL XL LVL3 (GOWN DISPOSABLE) ×2 IMPLANT
GOWN STRL REUS W/TWL XL LVL3 (GOWN DISPOSABLE) ×2
KIT TURNOVER KIT A (KITS) ×2 IMPLANT
MANIFOLD NEPTUNE II (INSTRUMENTS) ×2 IMPLANT
NS IRRIG 500ML POUR BTL (IV SOLUTION) ×2 IMPLANT
PACK EXTREMITY ARMC (MISCELLANEOUS) ×2 IMPLANT
PAD CAST 3X4 CTTN HI CHSV (CAST SUPPLIES) ×4 IMPLANT
PADDING CAST COTTON 3X4 STRL (CAST SUPPLIES) ×4
PASSER SUT SWANSON 36MM LOOP (INSTRUMENTS) IMPLANT
SPLINT CAST 1 STEP 3X12 (MISCELLANEOUS) ×2 IMPLANT
STOCKINETTE 48X4 2 PLY STRL (GAUZE/BANDAGES/DRESSINGS) ×2 IMPLANT
STOCKINETTE IMPERVIOUS 9X36 MD (GAUZE/BANDAGES/DRESSINGS) ×2 IMPLANT
STOCKINETTE STRL 4IN 9604848 (GAUZE/BANDAGES/DRESSINGS) ×2 IMPLANT
STRIP CLOSURE SKIN 1/2X4 (GAUZE/BANDAGES/DRESSINGS) ×2 IMPLANT
SUT ETHIBOND 0 MO6 C/R (SUTURE) ×2 IMPLANT
SUT PROLENE 4 0 PS 2 18 (SUTURE) ×2 IMPLANT
SUT VIC AB 2-0 CT2 27 (SUTURE) ×2 IMPLANT
SUT VIC AB 2-0 SH 27 (SUTURE)
SUT VIC AB 2-0 SH 27XBRD (SUTURE) ×2 IMPLANT
SUT VIC AB 3-0 SH 27 (SUTURE) ×2
SUT VIC AB 3-0 SH 27X BRD (SUTURE) ×2 IMPLANT
SYSTEM IMPLANT TIGHTROPE MINI (Anchor) IMPLANT
TRAP FLUID SMOKE EVACUATOR (MISCELLANEOUS) ×2 IMPLANT
WATER STERILE IRR 500ML POUR (IV SOLUTION) ×2 IMPLANT
WIRE Z .062 C-WIRE SPADE TIP (WIRE) IMPLANT

## 2023-02-21 NOTE — H&P (Signed)
History of Present Illness: Felicia Ellis is a 52 y.o. female who presents today for her surgical history and physical for upcoming left thumb CMC arthroplasty in addition to endoscopic left carpal tunnel release procedure. Surgery scheduled with Dr. Roland Rack on 02/21/2023. The patient denies any changes in her medical history since she was last evaluated. She denies any trauma or injury affecting the left hand since her last appointment. She does report moderate aching and throbbing discomfort at the base of the left thumb in addition to a burning and tingling discomfort that will affect primarily the middle finger of her left hand. She denies any catching or locking symptoms in her left hand. The patient denies any personal history of heart attack, stroke, asthma or COPD. No personal history of blood clots, she is not diabetic. She is right-hand dominant. No surgical history to the left hand. Pain score today is a 0 out of 10.  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 Roland Rack)  Arthroscopic debridement of symptomatic medial shelf plica, arthroscopic abrasion chondroplasty of patella and medial femoral condyle, and arthroscopic lateral release, right knee. Right 10/14/2019 (Dr. Roland Rack)  LAPAROSCOPIC TUBAL LIGATION   Past Family History: High blood pressure (Hypertension) Mother  Migraines Maternal Aunt   Medications:  Betamethasone dipropionate, augmented, (DIPROLENE) 0.05 % ointment Apply to the affected area twice daily for 7 days and then as needed. Do not exceed 2 weeks of treatment  or 45 g/week. 30 g 0  Cetirizine (ZYRTEC) 10 MG tablet Take 10 mg by mouth once daily  Etodolac (LODINE) 500 MG tablet TAKE 1 TABLET BY MOUTH TWICE DAILY 180 tablet 3  Ketorolac (TORADOL) 10 mg tablet TAKE 1 TABLET BY MOUTH AS NEEDED FOR MIGRAINE 20 tablet 0  Meclizine (ANTIVERT) 25 mg tablet Take 1 tablet (25 mg total) by mouth 3 (three) times daily as needed for Dizziness for up to 12 doses 12 tablet 0  Naproxen sodium (ALEVE) 220 MG tablet Take 440 mg by mouth as needed for Pain  TraMADoL (ULTRAM) 50 mg tablet TAKE 1 TABLET(50 MG) BY MOUTH EVERY 6 HOURS FOR UP TO 5 DAYS AS NEEDED FOR PAIN 20 tablet 0  venlafaxine (EFFEXOR-XR) 75 MG XR capsule TAKE 2 CAPSULES BY MOUTH DAILY 180 capsule 3   Allergies:  Flagyl [Metronidazole Hcl] Anaphylaxis  Imitrex [Sumatriptan Succinate] Anaphylaxis  Maxalt [Rizatriptan] Anaphylaxis  ANY TYPE OF NARCOTIC PAIN MEDICATION WILL CAUSE MIGRAINE HEADACHES AND EXTREME SICKNESS  Codeine Other (Headache)  Codeine Sulfate (Headache)  Morphine Other (migraines)  Penicillins (Nausea And Vomiting)  Did it involve swelling of the face/tongue/throat, SOB, or low BP? No Did it involve sudden or severe rash/hives, skin peeling, or any reaction on the inside of your mouth or nose? No Did you need to seek medical attention at a hospital or doctor's office? No When did it last happen? 20 years ago  If all above answers are "NO", may proceed with cephalosporin use.   Review of Systems:  A comprehensive 14 point ROS was performed, reviewed by me today, and the pertinent orthopaedic findings are documented in the HPI.  Physical Exam: BP 126/82  Ht 163.8 cm (5' 4.5")  Wt 91.9 kg (202 lb 9.6 oz)  BMI 34.24 kg/m  General/Constitutional: The patient appears to be well-nourished, well-developed, and in no acute distress. Neuro/Psych: Normal mood and affect, oriented to person, place and time. Eyes: Non-icteric. Pupils are equal, round, and reactive to light, and exhibit  synchronous movement. ENT: Unremarkable. Lymphatic: No palpable adenopathy. Respiratory: Lungs clear to auscultation, Normal chest excursion, No wheezes, and Non-labored breathing Cardiovascular: Regular rate and rhythm. No murmurs. and No edema, swelling or tenderness, except as noted in detailed exam. Integumentary: No impressive skin lesions present, except as noted in detailed exam. Musculoskeletal: Unremarkable, except as noted in detailed exam.  General: Well developed, well nourished 52 y.o. female in no apparent distress. Normal affect. Normal communication. Patient answers questions appropriately. The patient has a normal gait. There is no antalgic component. There is no hip lurch.   Left Upper Extremity: Examination of the left hand revealed mild swelling noted at the base of the left thumb. The patient had full range of motion of the wrist and hand, including the digits. She does have increased pain with thumb range of motion in all directions. Tenderness to palpation over the first dorsal compartment, negative Finkelstein test. Positive grind test. Tenderness with palpation directly over the left thumb CMC joint. The patient has full flexion extension of the second through fifth digits, full strength with extension and with FDS and FDP testing. There is no nodularity or cysts noted. There was no triggering of the digits. The patient had full composite fist. There was no angulation or rotation of the digits. The patient does have a positive grind test involving left thumb. The patient does report decrease sensation in the median nerve distribution. The patient does have a positive Tinel's test at positive Phalen's test the left upper extremity at today's visit as well.  Neurologic: The patient had sensation that was intact to light touch. She does have a positive Tinel's test and a positive Phalen's test the left upper extremity at today's appointment. The patient had full motor strength.  There was no tremor or clonus noted.   Vascular: The patient had good skin warmth. The radial and ulnar pulse were normal and intact. The patient had less than 2 second capillary refill.   Imaging: AP, lateral oblique images of the left hand were obtained today in the office and reviewed by me. These x-rays do demonstrate evidence of moderate left thumb CMC joint osteoarthritic changes. there is spur formation noted off of the lateral edge of the trapezium. There is worsening osteoarthritic changes with decreased joint space and increased subluxation at the [facility] joint at today's visit as well. No acute fractures visualized. No lytic lesions noted.   Impression: Primary osteoarthritis of first carpometacarpal joint of left hand [M18.12] Primary osteoarthritis of first carpometacarpal joint of left hand (primary encounter diagnosis) Carpal tunnel syndrome of left wrist  Plan:  1. Treatment options were discussed today with the patient. 2. The patient is scheduled for a left [facility] arthroplasty in addition to endoscopic carpal tunnel release with Dr. Roland Rack on 02/21/2023. 3. The patient was instructed on the risk and benefits of surgical intervention and wishes to proceed at this time. 4. This document will serve as a surgical history and physical for the patient. 5. The patient will follow-up with me per standard postop protocol. They can call the clinic they have any questions, new symptoms develop or symptoms worsen.  The procedure was discussed with the patient, as were the potential risks (including  bleeding, infection, nerve and/or blood vessel injury, persistent or recurrent pain, failure of the repair, progression of arthritis, need for further surgery, blood clots, strokes, heart attacks and/or arhythmias, pneumonia, etc.) and benefits. The patient states her understanding and wishes to proceed.    H&P reviewed and patient re-examined. No changes.

## 2023-02-21 NOTE — Op Note (Signed)
02/21/2023  2:59 PM  Patient:   Felicia Ellis  Pre-Op Diagnosis:   1. Left carpal tunnel syndrome.  2. 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  Post-Op Diagnosis:   Same.  Procedure:   1. Endoscopic left carpal tunnel release.  2. Suspension arthroplasty left thumb CMC joint.   Surgeon:   Pascal Lux, MD  Assistant:   None  Anesthesia:   General LMA  Findings:   As above.  Complications:   None  EBL:   0 cc  Fluids:   500 cc crystalloid  TT:   65 minutes at 250 mmHg  Drains:   None  Closure:   3-0 Vicryl subcuticular sutures for thumb incisions and 4-0 Prolene interrupted sutures for wrist incision  Implants:   Arthrex 1.1 mm CMC Mini Tightrope.  Brief Clinical Note:   The patient is a 52 year old female with a history of basilar left thumb pain. The patient's symptoms have progressed despite medications, activity modification, injections, splinting, etc. The patient's history and examination are consistent with advanced degenerative joint disease of the left thumb CMC joint which was confirmed by plain radiographs. The patient presents at this time for a suspension arthroplasty of the left thumb CMC joint.  The patient also notes a history of gradually worsening pain and paresthesias to her left hand. Her symptoms have progressed despite medications, activity modification, etc. Her history and examination are consistent with carpal tunnel syndrome. She also presents at this time for an endoscopic left carpal tunnel release.  Procedure:    The patient was brought into the operating room and lain in the supine position. After adequate general laryngeal mask anesthesia was obtained, the patient's left hand and upper extremity were prepped with ChloraPrep solution before being draped sterilely. Preoperative antibiotics were administered. A timeout was performed to verify the appropriate surgical site before the limb was exsanguinated with an Esmarch and the  tourniquet inflated to 250 mmHg.   First the carpal tunnel portion of the surgery was addressed. An approximately 1.5-2 cm incision was made over the volar wrist flexion crease, centered over the palmaris longus tendon. The incision was carried down through the subcutaneous tissues with care taken to identify and protect any neurovascular structures. The distal forearm fascia was penetrated just proximal to the transverse carpal ligament. The soft tissues were released off the superficial and deep surfaces of the distal forearm fascia and this was released proximally for 3-4 cm under direct visualization.  Attention was directed distally. The Soil scientist was passed beneath the transverse carpal ligament along the ulnar aspect of the carpal tunnel and used to release any adhesions as well as to remove any adherent synovial tissue before first the smaller then the larger of the two dilators were passed beneath the transverse carpal ligament along the ulnar margin of the carpal tunnel. The slotted cannula was introduced and the endoscope was placed into the slotted cannula and the undersurface of the transverse carpal ligament visualized. The distal margin of the transverse carpal ligament was marked by placing a 25-gauge needle percutaneously at [place] cardinal point so that it entered the distal portion of the slotted cannula. Under endoscopic visualization, the transverse carpal ligament was released from proximal to distal using the end-cutting blade. A second pass was performed to ensure complete release of the ligament. The adequacy of release was verified both endoscopically and by palpation using the freer elevator.  Next, the thumb CMC portion of the procedure was begun. An  approximately 4-5 cm longitudinal incision was made centered over the radial aspect of the thumb CMC joint. The incision was carried down through the subcutaneous cutaneous tissues with care taken to identify and protect the  local neurovascular structures. Several small veins were cauterized with bipolar electrocautery. A longitudinal incision was made through the capsular tissues over the dorsal aspect of the thumb CMC joint. Subperiosteal dissection was carried out to expose the trapezium dorsally and volarly. Once the [facility] and scaphotrapezial joints were identified, a sagittal cut was made in the trapezium using the micro-oscillating saw. This cut extended approximately two-thirds down through the bone. A [place] osteotome was used to complete transection of the bone. The bone was then removed piecemeal using rongeurs and further dissection. The flexor carpi radialis tendon was identified at the base of the defect. The base of the thumb metacarpal was prepared by exposing its radial base.  The Arthrex 1.1 mm guidewire was drilled up through the proximal end of the thumb metacarpal beginning at the dorsoradial surface and advancing into the volar radial aspect of the proximal index metacarpal shaft. The adequacy of pin position was verified using FluoroScan imaging in AP, lateral, and oblique projections and found to be excellent.  An approximate 1.5 cm incision was made over the dorsal aspect of the proximal end of the index metacarpal. The incision was carried down through the subcutaneous tissues with care taken to avoid damaging the extensor tendon. The interosseous muscle was dissected away from the dorso-ulnar aspect of the proximal index metacarpal shaft before the guidewire was advanced through the final cortex into this wound. The suture was passed through the nitinol wire loop and pulled across the thumb and index metacarpals, seating the mini-Endobutton against the proximal end of the thumb metacarpal. The suture was cut and each and passed through the second mini-Endobutton. The Endobutton was advanced to rest against the dorso-ulnar aspect of the proximal index metacarpal shaft before it was tied securely with appropriate  tension to stabilize the base of the thumb metacarpal against the base of the index metacarpal. Again, the adequacy of Endobutton position and metacarpal reduction was verified fluoroscopically in AP, lateral, and oblique projections and found to be excellent. The thumb was then placed through a range of motion. It was able to be abducted and adducted fully and remained distracted to gentle axial loading.  All of the wounds were copiously irrigated with sterile saline solution using bulb irrigation. The thumb CMC capsular tissues were reapproximated using 2-0 Vicryl interrupted sutures before the skin was closed using 3-0 Vicryl subcuticular interrupted sutures. Benzoin and Steri-Strips were applied to the skin. The skin of the more dorsal incision also was closed using 3-0 Vicryl subcuticular sutures. The volar wrist incision was closed using 4-0 Prolene interrupted sutures. A total of 20 cc of 0.5% plain Sensorcaine was injected in and around both incisions to help with postoperative analgesia. Sterile bulky dressings were applied to the wounds before the patient was placed into a fiberglass thumb spica splint maintaining the thumb in the position of function. The patient was then awakened, extubated, and returned to the recovery room in satisfactory condition after tolerating the procedure well.

## 2023-02-21 NOTE — Transfer of Care (Signed)
Immediate Anesthesia Transfer of Care Note  Patient: Felicia Ellis  Procedure(s) Performed: LEFT CMC ARTHROPLASTY (Left: Thumb) CARPAL TUNNEL RELEASE ENDOSCOPIC (Left: Wrist)  Patient Location: PACU  Anesthesia Type:General  Level of Consciousness: drowsy  Airway & Oxygen Therapy: Patient Spontanous Breathing and Patient connected to face mask oxygen  Post-op Assessment: Report given to RN and Post -op Vital signs reviewed and stable  Post vital signs: stable  Last Vitals:  Vitals Value Taken Time  BP 107/71 02/21/23 1503  Temp    Pulse 58 02/21/23 1506  Resp 13 02/21/23 1506  SpO2 98 % 02/21/23 1506  Vitals shown include unvalidated device data.  Last Pain:  Vitals:   02/21/23 1150  TempSrc: Temporal  PainSc: 0-No pain         Complications: No notable events documented.

## 2023-02-21 NOTE — Discharge Instructions (Addendum)
Orthopedic discharge instructions: Keep splint dry and intact. Keep hand elevated above heart level. Apply ice to affected area frequently. Take Aleve 2 tabs BID with meals for 5-7 days, then as necessary. Take ES Tylenol as needed for breakthrough pain.  Return for follow-up in 10-14 days or as scheduled.   AMBULATORY SURGERY  DISCHARGE INSTRUCTIONS  The drugs that you were given will stay in your system until tomorrow so for the next 24 hours you should not:  Drive an automobile Make any legal decisions Drink any alcoholic beverage  You may resume regular meals tomorrow.  Today it is better to start with liquids and gradually work up to solid foods.  You may eat anything you prefer, but it is better to start with liquids, then soup and crackers, and gradually work up to solid foods.  Please notify your doctor immediately if you have any unusual bleeding, trouble breathing, redness and pain at the surgery site, drainage, fever, or pain not relieved by medication.  Additional Instructions:  Please contact your physician with any problems or Same Day Surgery at [phone], Monday through Friday 6 am to 4 pm, or [place] [facility] number at [phone].

## 2023-02-21 NOTE — Anesthesia Preprocedure Evaluation (Signed)
Anesthesia Evaluation  Patient identified by MRN, date of birth, ID band Patient awake    Reviewed: Allergy & Precautions, H&P , NPO status , reviewed documented beta blocker date and time   History of Anesthesia Complications (+) PONV and history of anesthetic complications  Airway Mallampati: II  TM Distance: >3 FB Neck ROM: full    Dental  (+) Teeth Intact, Caps, Dental Advidsory Given   Pulmonary neg pulmonary ROS   Pulmonary exam normal        Cardiovascular negative cardio ROS Normal cardiovascular exam     Neuro/Psych  Headaches, neg Seizures PSYCHIATRIC DISORDERS Anxiety        GI/Hepatic negative GI ROS,,,  Endo/Other  negative endocrine ROS    Renal/GU negative Renal ROS  negative genitourinary   Musculoskeletal   Abdominal   Peds negative pediatric ROS (+)  Hematology negative hematology ROS (+)   Anesthesia Other Findings Past Medical History: No date: Anxiety No date: Complication of anesthesia     Comment:  migraines No date: Dyspnea No date: Fatty liver No date: Migraines (severe migraines w narcotics) No date: PONV (postoperative nausea and vomiting) No date: Seasonal allergies Past Surgical History: No date: ABDOMINAL HYSTERECTOMY No date: BLADDER REPAIR No date: CESAREAN SECTION BMI    Body Mass Index: 32.79 kg/m     Reproductive/Obstetrics                             Anesthesia Physical Anesthesia Plan  ASA: 2  Anesthesia Plan: General   Post-op Pain Management:    Induction: Intravenous  PONV Risk Score and Plan: 4 or greater and Ondansetron, Treatment may vary due to age or medical condition, Midazolam, Dexamethasone, Propofol infusion and TIVA  Airway Management Planned: LMA  Additional Equipment:   Intra-op Plan:   Post-operative Plan: Extubation in OR  Informed Consent: I have reviewed the patients History and Physical, chart, labs and  discussed the procedure including the risks, benefits and alternatives for the proposed anesthesia with the patient or authorized representative who has indicated his/her understanding and acceptance.     Dental Advisory Given  Plan Discussed with: CRNA  Anesthesia Plan Comments:         Anesthesia Quick Evaluation

## 2023-02-22 NOTE — Anesthesia Postprocedure Evaluation (Signed)
Anesthesia Post Note  Patient: CARLY ROADY  Procedure(s) Performed: LEFT CMC ARTHROPLASTY (Left: Thumb) CARPAL TUNNEL RELEASE ENDOSCOPIC (Left: Wrist)  Anesthesia Type: General Anesthetic complications: no  There were no known notable events for this encounter.   Last Vitals:  Vitals:   02/21/23 1615 02/21/23 1629  BP: 116/74 119/80  Pulse: 80 77  Resp: 13 14  Temp: (!) 36.4 C (!) 36.2 C  SpO2: 93% 94%    Last Pain:  Vitals:   02/22/23 0847  TempSrc:   PainSc: 0-No pain                 Martha Clan

## 2023-02-25 ENCOUNTER — Encounter: Payer: Self-pay | Admitting: Surgery

## 2023-03-07 ENCOUNTER — Encounter: Payer: Self-pay | Admitting: Dermatology

## 2023-03-07 ENCOUNTER — Ambulatory Visit: Payer: Managed Care, Other (non HMO) | Admitting: Dermatology

## 2023-03-07 VITALS — BP 135/85 | HR 71

## 2023-03-07 DIAGNOSIS — L738 Other specified follicular disorders: Secondary | ICD-10-CM | POA: Diagnosis not present

## 2023-03-07 DIAGNOSIS — L821 Other seborrheic keratosis: Secondary | ICD-10-CM

## 2023-03-07 DIAGNOSIS — D229 Melanocytic nevi, unspecified: Secondary | ICD-10-CM

## 2023-03-07 DIAGNOSIS — B079 Viral wart, unspecified: Secondary | ICD-10-CM

## 2023-03-07 DIAGNOSIS — D485 Neoplasm of uncertain behavior of skin: Secondary | ICD-10-CM

## 2023-03-07 DIAGNOSIS — L304 Erythema intertrigo: Secondary | ICD-10-CM | POA: Diagnosis not present

## 2023-03-07 DIAGNOSIS — Z1283 Encounter for screening for malignant neoplasm of skin: Secondary | ICD-10-CM | POA: Diagnosis not present

## 2023-03-07 DIAGNOSIS — L918 Other hypertrophic disorders of the skin: Secondary | ICD-10-CM

## 2023-03-07 DIAGNOSIS — L578 Other skin changes due to chronic exposure to nonionizing radiation: Secondary | ICD-10-CM

## 2023-03-07 DIAGNOSIS — D489 Neoplasm of uncertain behavior, unspecified: Secondary | ICD-10-CM

## 2023-03-07 DIAGNOSIS — L814 Other melanin hyperpigmentation: Secondary | ICD-10-CM

## 2023-03-07 NOTE — Progress Notes (Signed)
Follow-Up Visit   Subjective  Felicia Ellis is a 52 y.o. female who presents for the following: Skin Cancer Screening and Full Body Skin Exam Spots at hands Spots at right eyelid Spot at left eyelid Spot at left flank area  The patient presents for Total-Body Skin Exam (TBSE) for skin cancer screening and mole check. The patient has spots, moles and lesions to be evaluated, some may be new or changing and the patient has concerns that these could be cancer.    The following portions of the chart were reviewed this encounter and updated as appropriate: medications, allergies, medical history  Review of Systems:  No other skin or systemic complaints except as noted in HPI or Assessment and Plan.  Objective  Well appearing patient in no apparent distress; mood and affect are within normal limits.  A full examination was performed including scalp, head, eyes, ears, nose, lips, neck, chest, axillae, abdomen, back, buttocks, bilateral upper extremities, bilateral lower extremities, hands, feet, fingers, toes, fingernails, and toenails. All findings within normal limits unless otherwise noted below.   Relevant physical exam findings are noted in the Assessment and Plan.  Left Flank 0.4 cm erythematous skin colored papule   right upper eyelid 0.3 cm erythematous skin colored papule     Assessment & Plan   WART Exam: verrucous papule  Discussed viral / HPV (Human Papilloma Virus) etiology and risk of spread /infectivity to other areas of body as well as to other people.  Multiple treatments and methods may be required to clear warts and it is possible treatment may not be successful.  Treatment risks include discoloration; scarring and there is still potential for wart recurrence.  Treatment Plan:  Recommend over the counter salicylic acid wart treatment   INTERTRIGO Exam Erythematous patches at inguinal folds  Chronic condition with duration or expected duration over one  year. Currently well-controlled.  Intertrigo is a chronic recurrent rash that occurs in skin fold areas that may be associated with friction; heat; moisture; yeast; fungus; and bacteria.  It is exacerbated by increased movement / activity; sweating; and higher atmospheric temperature.  Treatment Plan Patient deferred treatment at this time. Patient is doing well on otc doterra tea tree oil    ROSACEA Exam Mid face erythema with telangiectasias +/- scattered inflammatory papules mid face   Chronic condition with duration or expected duration over one year. Currently well-controlled.  Rosacea is a chronic progressive skin condition usually affecting the face of adults, causing redness and/or acne bumps. It is treatable but not curable. It sometimes affects the eyes (ocular rosacea) as well. It may respond to topical and/or systemic medication and can flare with stress, sun exposure, alcohol, exercise, topical steroids (including hydrocortisone/cortisone 10) and some foods.  Daily application of broad spectrum spf 30+ sunscreen to face is recommended to reduce flares.  Patient denies grittiness of the eyes  Treatment Plan Patient deferred treatment at this time.  Sebaceous Hyperplasia - Small yellow papules with a central dell - Benign-appearing - Observe. Call for changes.  Acrochordons (Skin Tags) Left eyelid, right eyelid  - Fleshy, skin-colored pedunculated papules - Benign appearing.  - Observe. - If desired, they can be removed with an in office procedure that is not covered by insurance. - Please call the clinic if you notice any new or changing lesions.  LENTIGINES, SEBORRHEIC KERATOSES, HEMANGIOMAS - Benign normal skin lesions - Benign-appearing - Call for any changes  MELANOCYTIC NEVI - Tan-brown and/or pink-flesh-colored symmetric macules and  papules - Benign appearing on exam today - Observation - Call clinic for new or changing moles - Recommend daily use of  broad spectrum spf 30+ sunscreen to sun-exposed areas.   ACTINIC DAMAGE - Chronic condition, secondary to cumulative UV/sun exposure - diffuse scaly erythematous macules with underlying dyspigmentation - Recommend daily broad spectrum sunscreen SPF 30+ to sun-exposed areas, reapply every 2 hours as needed.  - Staying in the shade or wearing long sleeves, sun glasses (UVA+UVB protection) and wide brim hats (4-inch brim around the entire circumference of the hat) are also recommended for sun protection.  - Call for new or changing lesions.  SKIN CANCER SCREENING PERFORMED TODAY.  Neoplasm of uncertain behavior (2) Left Flank  Epidermal / dermal shaving  Lesion diameter (cm):  0.4 Informed consent: discussed and consent obtained   Timeout: patient name, date of birth, surgical site, and procedure verified   Patient was prepped and draped in usual sterile fashion: area prepped with isopropyl alcohol. Anesthesia: the lesion was anesthetized in a standard fashion   Anesthetic:  1% lidocaine w/ epinephrine 1-100,000 buffered w/ 8.4% NaHCO3 Instrument used: flexible razor blade   Hemostasis achieved with: aluminum chloride   Outcome: patient tolerated procedure well   Post-procedure details: wound care instructions given   Additional details:  Mupirocin and a bandage applied  Anatomic Pathology Report  right upper eyelid  Epidermal / dermal shaving  Lesion diameter (cm):  0.3 Informed consent: discussed and consent obtained   Timeout: patient name, date of birth, surgical site, and procedure verified   Patient was prepped and draped in usual sterile fashion: area prepped with isopropyl alcohol. Anesthesia: the lesion was anesthetized in a standard fashion   Anesthetic:  1% lidocaine w/ epinephrine 1-100,000 buffered w/ 8.4% NaHCO3 Instrument used: flexible razor blade   Hemostasis achieved with: aluminum chloride   Outcome: patient tolerated procedure well   Post-procedure details:  wound care instructions given   Additional details:  Mupirocin and a bandage applied  Left flank  r/o irritated skin tag vs nevus vs neurofibroma Right eyelid r/o irritated skin tag vs isk     [employer] employee    Return in about 1 year (around 03/06/2024) for tbse.  I, Asher MuirMelissa Wilson, CMA, am acting as scribe for Darden DatesVIRGINA Brazen Domangue, MD.   Documentation: I have reviewed the above documentation for accuracy and completeness, and I agree with the above.  Darden DatesVIRGINA Felita Bump, MD

## 2023-03-07 NOTE — Patient Instructions (Addendum)
Biopsy Wound Care Instructions  Leave the original bandage on for 24 hours if possible.  If the bandage becomes soaked or soiled before that time, it is OK to remove it and examine the wound.  A small amount of post-operative bleeding is normal.  If excessive bleeding occurs, remove the bandage, place gauze over the site and apply continuous pressure (no peeking) over the area for 30 minutes. If this does not work, please call our clinic as soon as possible or page your doctor if it is after hours.   Once a day, cleanse the wound with soap and water. It is fine to shower. If a thick crust develops you may use a Q-tip dipped into dilute hydrogen peroxide (mix 1:1 with water) to dissolve it.  Hydrogen peroxide can slow the healing process, so use it only as needed.    After washing, apply petroleum jelly (Vaseline) or an antibiotic ointment if your doctor prescribed one for you, followed by a bandage.    For best healing, the wound should be covered with a layer of ointment at all times. If you are not able to keep the area covered with a bandage to hold the ointment in place, this may mean re-applying the ointment several times a day.  Continue this wound care until the wound has healed and is no longer open.   Itching and mild discomfort is normal during the healing process. However, if you develop pain or severe itching, please call our office.   If you have any discomfort, you can take Tylenol (acetaminophen) or ibuprofen as directed on the bottle. (Please do not take these if you have an allergy to them or cannot take them for another reason).  Some redness, tenderness and white or yellow material in the wound is normal healing.  If the area becomes very sore and red, or develops a thick yellow-green material (pus), it may be infected; please notify us.    If you have stitches, return to clinic as directed to have the stitches removed. You will continue wound care for 2-3 days after the stitches  are removed.   Wound healing continues for up to one year following surgery. It is not unusual to experience pain in the scar from time to time during the interval.  If the pain becomes severe or the scar thickens, you should notify the office.    A slight amount of redness in a scar is expected for the first six months.  After six months, the redness will fade and the scar will soften and fade.  The color difference becomes less noticeable with time.  If there are any problems, return for a post-op surgery check at your earliest convenience.  To improve the appearance of the scar, you can use silicone scar gel, cream, or sheets (such as Mederma or Serica) every night for up to one year. These are available over the counter (without a prescription).  Please call our office at [phone] for any questions or concerns.    Rosacea  What is rosacea? Rosacea (say: ro-zay-sha) is a common skin disease that usually begins as a trend of flushing or blushing easily.  As rosacea progresses, a persistent redness in the center of the face will develop and may gradually spread beyond the nose and cheeks to the forehead and chin.  In some cases, the ears, chest, and back could be affected.  Rosacea may appear as tiny blood vessels or small red bumps that occur in crops.  Frequently they can contain pus, and are called "pustules".  If the bumps do not contain pus, they are referred to as "papules".  Rarely, in prolonged, untreated cases of rosacea, the oil glands of the nose and cheeks may become permanently enlarged.  This is called rhinophyma, and is seen more frequently in men.  Signs and Risks In its beginning stages, rosacea tends to come and go, which makes it difficult to recognize.  It can start as intermittent flushing of the face.  Eventually, blood vessels may become permanently visible.  Pustules and papules can appear, but can be mistaken for adult acne.  People of all races, ages, genders and  ethnic groups are at risk of developing rosacea.  However, it is more common in women (especially around menopause) and adults with fair skin between the ages of 28 and 74.  Treatment Dermatologists typically recommend a combination of treatments to effectively manage rosacea.  Treatment can improve symptoms and may stop the progression of the rosacea.  Treatment may involve both topical and oral medications.  The tetracycline antibiotics are often used for their anti-inflammatory effect; however, because of the possibility of developing antibiotic resistance, they should not be used long term at full dose.  For dilated blood vessels the options include electrodessication (uses electric current through a small needle), laser treatment, and cosmetics to hide the redness.   With all forms of treatment, improvement is a slow process, and patients may not see any results for the first 3-4 weeks.  It is very important to avoid the sun and other triggers.  Patients must wear sunscreen daily.  Skin Care Instructions: Cleanse the skin with a mild soap such as CeraVe cleanser, Cetaphil cleanser, or Dove soap once or twice daily as needed. Moisturize with Eucerin Redness Relief Daily Perfecting Lotion (has a subtle green tint), CeraVe Moisturizing Cream, or Oil of Olay Daily Moisturizer with sunscreen every morning and/or night as recommended. Makeup should be "non-comedogenic" (won't clog pores) and be labeled "for sensitive skin". Good choices for cosmetics are: Neutrogena, Almay, and Physician's Formula.  Any product with a green tint tends to offset a red complexion. If your eyes are dry and irritated, use artificial tears 2-3 times per day and cleanse the eyelids daily with baby shampoo.  Have your eyes examined at least every 2 years.  Be sure to tell your eye doctor that you have rosacea. Alcoholic beverages tend to cause flushing of the skin, and may make rosacea worse. Always wear sunscreen, protect your  skin from extreme hot and cold temperatures, and avoid spicy foods, hot drinks, and mechanical irritation such as rubbing, scrubbing, or massaging the face.  Avoid harsh skin cleansers, cleansing masks, astringents, and exfoliation. If a particular product burns or makes your face feel tight, then it is likely to flare your rosacea. If you are having difficulty finding a sunscreen that you can tolerate, you may try switching to a chemical-free sunscreen.  These are ones whose active ingredient is zinc oxide or titanium dioxide only.  They should also be fragrance free, non-comedogenic, and labeled for sensitive skin. Rosacea triggers may vary from person to person.  There are a variety of foods that have been reported to trigger rosacea.  Some patients find that keeping a diary of what they were doing when they flared helps them avoid triggers.      Seborrheic Keratosis  What causes seborrheic keratoses? Seborrheic keratoses are harmless, common skin growths that first appear during adult life.  As time goes by, more growths appear.  Some people may develop a large number of them.  Seborrheic keratoses appear on both covered and uncovered body parts.  They are not caused by sunlight.  The tendency to develop seborrheic keratoses can be inherited.  They vary in color from skin-colored to gray, brown, or even black.  They can be either smooth or have a rough, warty surface.   Seborrheic keratoses are superficial and look as if they were stuck on the skin.  Under the microscope this type of keratosis looks like layers upon layers of skin.  That is why at times the top layer may seem to fall off, but the rest of the growth remains and re-grows.    Treatment Seborrheic keratoses do not need to be treated, but can easily be removed in the office.  Seborrheic keratoses often cause symptoms when they rub on clothing or jewelry.  Lesions can be in the way of shaving.  If they become inflamed, they can cause  itching, soreness, or burning.  Removal of a seborrheic keratosis can be accomplished by freezing, burning, or surgery. If any spot bleeds, scabs, or grows rapidly, please return to have it checked, as these can be an indication of a skin cancer.      Melanoma ABCDEs  Melanoma is the most dangerous type of skin cancer, and is the leading cause of death from skin disease.  You are more likely to develop melanoma if you: Have light-colored skin, light-colored eyes, or red or blond hair Spend a lot of time in the sun Tan regularly, either outdoors or in a tanning bed Have had blistering sunburns, especially during childhood Have a close family member who has had a melanoma Have atypical moles or large birthmarks  Early detection of melanoma is key since treatment is typically straightforward and cure rates are extremely high if we catch it early.   The first sign of melanoma is often a change in a mole or a new dark spot.  The ABCDE system is a way of remembering the signs of melanoma.  A for asymmetry:  The two halves do not match. B for border:  The edges of the growth are irregular. C for color:  A mixture of colors are present instead of an even brown color. D for diameter:  Melanomas are usually (but not always) greater than 35mm - the size of a pencil eraser. E for evolution:  The spot keeps changing in size, shape, and color.  Please check your skin once per month between visits. You can use a small mirror in front and a large mirror behind you to keep an eye on the back side or your body.   If you see any new or changing lesions before your next follow-up, please call to schedule a visit.  Please continue daily skin protection including broad spectrum sunscreen SPF 30+ to sun-exposed areas, reapplying every 2 hours as needed when you're outdoors.   Staying in the shade or wearing long sleeves, sun glasses (UVA+UVB protection) and wide brim hats (4-inch brim around the entire  circumference of the hat) are also recommended for sun protection.          Due to recent changes in healthcare laws, you may see results of your pathology and/or laboratory studies on MyChart before the doctors have had a chance to review them. We understand that in some cases there may be results that are confusing or concerning to you. Please understand  that not all results are received at the same time and often the doctors may need to interpret multiple results in order to provide you with the best plan of care or course of treatment. Therefore, we ask that you please give [place] 2 business days to thoroughly review all your results before contacting the office for clarification. Should we see a critical lab result, you will be contacted sooner.   If You Need Anything After Your Visit  If you have any questions or concerns for your doctor, please call our main line at [phone] and press option 4 to reach your doctor's medical assistant. If no one answers, please leave a voicemail as directed and we will return your call as soon as possible. Messages left after 4 pm will be answered the following business day.   You may also send [place] a message via [place]. We typically respond to MyChart messages within 1-2 business days.  For prescription refills, please ask your pharmacy to contact our office. Our fax number is [phone].  If you have an urgent issue when the clinic is closed that cannot wait until the next business day, you can page your doctor at the number below.    Please note that while we do our best to be available for urgent issues outside of office hours, we are not available 24/7.   If you have an urgent issue and are unable to reach [place], you may choose to seek medical care at your doctor's office, retail clinic, urgent care center, or emergency room.  If you have a medical emergency, please immediately call 911 or go to the emergency department.  Pager Numbers  - Dr.  Nehemiah Massed: [phone]  - Dr. Laurence Ferrari: [phone]  - Dr. Nicole Kindred: [phone]  In the event of inclement weather, please call our main line at [phone] for an update on the status of any delays or closures.  Dermatology Medication Tips: Please keep the boxes that topical medications come in in order to help keep track of the instructions about where and how to use these. Pharmacies typically print the medication instructions only on the boxes and not directly on the medication tubes.   If your medication is too expensive, please contact our office at [phone] option 4 or send [place] a message through [place].   We are unable to tell what your co-pay for medications will be in advance as this is different depending on your insurance coverage. However, we may be able to find a substitute medication at lower cost or fill out paperwork to get insurance to cover a needed medication.   If a prior authorization is required to get your medication covered by your insurance company, please allow [place] 1-2 business days to complete this process.  Drug prices often vary depending on where the prescription is filled and some pharmacies may offer cheaper prices.  The website www.goodrx.com contains coupons for medications through different pharmacies. The prices here do not account for what the cost may be with help from insurance (it may be cheaper with your insurance), but the website can give you the price if you did not use any insurance.  - You can print the associated coupon and take it with your prescription to the pharmacy.  - You may also stop by our office during regular business hours and pick up a GoodRx coupon card.  - If you need your prescription sent electronically to a different pharmacy, notify our office through [facility] or by phone  at [phone] option 4.     Si Usted Necesita Algo Despus de Su Visita  Tambin puede enviarnos un mensaje a travs de Pharmacist, community. Por lo  general respondemos a los mensajes de MyChart en el transcurso de 1 a 2 das hbiles.  Para renovar recetas, por favor pida a su farmacia que se ponga en contacto con nuestra oficina. Harland Dingwall de fax es [place] [phone].  Si tiene un asunto urgente cuando la clnica est cerrada y que no puede esperar hasta el siguiente da hbil, puede llamar/localizar a su doctor(a) al nmero que aparece a continuacin.   Por favor, tenga en cuenta que aunque hacemos todo lo posible para estar disponibles para asuntos urgentes fuera del horario de [place], no estamos disponibles las 24 horas del da, los 7 das de la [place].   Si tiene un problema urgente y no puede comunicarse con nosotros, puede optar por buscar atencin mdica  en el consultorio de su doctor(a), en una clnica privada, en un centro de atencin urgente o en una sala de emergencias.  Si tiene Engineering geologist, por favor llame inmediatamente al 911 o vaya a la sala de emergencias.  Nmeros de bper  - Dr. Nehemiah Massed: [phone]  - Dra. Moye: [phone]  - Dra. Nicole Kindred: [phone]  En caso de inclemencias del [place], por favor llame a Johnsie Kindred principal al [phone] para una actualizacin sobre el [place] de cualquier retraso o cierre.  Consejos para la medicacin en dermatologa: Por favor, guarde las cajas en las que vienen los medicamentos de uso tpico para ayudarle a seguir las instrucciones sobre dnde y cmo usarlos. Las farmacias generalmente imprimen las instrucciones del medicamento slo en las cajas y no directamente en los tubos del [place].   Si su medicamento es muy caro, por favor, pngase en contacto con Zigmund Daniel llamando al [phone] y presione la opcin 4 o envenos un mensaje a travs de Pharmacist, community.   No podemos decirle cul ser su copago por los medicamentos por adelantado ya que esto es diferente dependiendo de la cobertura de su seguro. Sin embargo, es posible que podamos encontrar un  medicamento sustituto a Electrical engineer un formulario para que el seguro cubra el medicamento que se considera necesario.   Si se requiere una autorizacin previa para que su compaa de seguros [place] su medicamento, por favor permtanos de 1 a 2 das hbiles para completar este proceso.  Los precios de los medicamentos varan con frecuencia dependiendo del Environmental consultant de dnde se surte la receta y alguna farmacias pueden ofrecer precios ms baratos.  El sitio web www.goodrx.com tiene cupones para medicamentos de Airline pilot. Los precios aqu no tienen en cuenta lo que podra costar con la ayuda del seguro (puede ser ms barato con su seguro), pero el sitio web puede darle el precio si no utiliz Research scientist (physical sciences).  - Puede imprimir el cupn correspondiente y llevarlo con su receta a la farmacia.  - Tambin puede pasar por nuestra oficina durante el horario de atencin regular y Charity fundraiser una tarjeta de cupones de GoodRx.  - Si necesita que su receta se enve electrnicamente a una farmacia diferente, informe a nuestra oficina a travs de MyChart de [place] o por telfono llamando al [phone] y presione la opcin 4.

## 2023-03-13 LAB — ANATOMIC PATHOLOGY REPORT

## 2023-03-14 ENCOUNTER — Telehealth: Payer: Self-pay

## 2023-03-14 NOTE — Telephone Encounter (Signed)
-----   Message from Sandi Mealy, MD sent at 03/14/2023  1:35 PM EDT ----- Comment: Specimen 1-Skin Biopsy, Left Flank: ACROCHORDON (FIBROEPITHELIAL POLYP), INFLAMED. Specimen 2-Skin Biopsy, Right Eyelid: ACROCHORDON (FIBROEPITHELIAL POLYP), INFLAMED.   Skin tag, benign, no additional treatment needed.    MAs please call. Thank you!

## 2023-03-14 NOTE — Telephone Encounter (Signed)
Discussed pathology results. Patient voiced understanding.  

## 2023-07-08 ENCOUNTER — Telehealth: Payer: Managed Care, Other (non HMO) | Admitting: Nurse Practitioner

## 2023-07-08 DIAGNOSIS — L309 Dermatitis, unspecified: Secondary | ICD-10-CM

## 2023-07-08 MED ORDER — PREDNISONE 10 MG PO TABS: ORAL_TABLET | ORAL | 0 refills | Status: AC

## 2023-07-08 NOTE — Progress Notes (Signed)
E-Visit for Eczema  We are sorry that you are not feeling well. Here is how we plan to help! Based on what you shared with me it looks like you have eczema (atopic dermatitis).  Although the cause of eczema is not completely understood, genetics appear to play a strong role, and people with a family history of eczema are at increased risk of developing the condition. In most people with eczema, there is a genetic abnormality in the outermost layer of the skin, called the epidermis   Most people with eczema develop their first symptoms as children, before the age of 57. Intense itching of the skin, patches of redness, small bumps, and skin flaking are common. Scratching can further inflame the skin and worsen the itching. The itchiness may be more noticeable at nighttime.  Eczema commonly affects the back of the neck, the elbow creases, and the backs of the knees. Other affected areas may include the face, wrists, and forearms. The skin may become thickened and darkened, or even scarred, from repeated scratching. Eliminating factors that aggravate your eczema symptoms can help to control the symptoms. Possible triggers may include: ? Cold or dry environments ? Sweating ? Emotional stress or anxiety ? Rapid temperature changes ? Exposure to certain chemicals or cleaning solutions, including soaps and detergents, perfumes and cosmetics, wool or synthetic fibers, dust, sand, and cigarette smoke Keeping your skin hydrated Emollients -- Emollients are creams and ointments that moisturize the skin and prevent it from drying out. The best emollients for people with eczema are thick creams (such as Eucerin, Cetaphil, and Nutraderm) or ointments (such as petroleum jelly, Aquaphor, and Vaseline), which contain little to no water. Emollients are most effective when applied immediately after bathing. Emollients can be applied twice a day or more often if needed. Lotions contain more water than creams and  ointments and are less effective for moisturizing the skin. Bathing -- It is not clear if showers or baths are better for keeping the skin hydrated. Lukewarm baths or showers can hydrate and cool the skin, temporarily relieving itching from eczema. An unscented, mild soap or non-soap cleanser (such as Cetaphil) should be used sparingly. Apply an emollient immediately after bathing or showering to prevent your skin from drying out as a result of water evaporation. Emollient bath additives (products you add to the bath water) have not been found to help relieve symptoms. Hot or long baths (more than 10 to 15 minutes) and showers should be avoided since they can dry out the skin.  Based on what you shared with me you may have eczema.  We have prescribed prednisone: Meds ordered this encounter  Medications   predniSONE (DELTASONE) 10 MG tablet    Sig: Take 4 tablets (40mg ) on days 1-4, then 3 tablets (30mg ) on days 5-8, then 2 tablets (20mg ) on days 9-11, then 1 tablet daily for days 12-14. Take with food.    Dispense:  37 tablet    Refill:  0        I recommend dilute bleach baths for people with eczema. These baths help to decrease the number of bacteria on the skin that can cause infections or worsen symptoms. To prepare a bleach bath, one-fourth to one-half cup of bleach is placed in a full bathtub (about 40 gallons) of water. Bleach baths are usually taken for 5 to 10 minutes twice per week and should be followed by application of an emollient (listed above). I recommend you take Benadryl 25mg  - 50mg   every 4 hours to control the symptoms (including itching) but if they last over 24 hours it is best that you see an office based provider for follow up.  HOME CARE: Take lukewarm showers or baths Apply creams and ointments to prevent the skin from drying (Eucerin, Cetaphil, Nutraderm, petroleum jelly, Aquaphor or Vaseline) - these products contain less water than other lotions and are more  effective for moisturizing the skin Limit exposure to cold or dry environments, sweating, emotional stress and anxiety, rapid temperature changes and exposure to chemicals and cleaning products, soaps and detergents, perfumes, cosmetics, wool and synthetic fibers, dust, sand and cigarette- factors which can aggravate eczema symptoms.  Use a hydrocortisone cream once or twice a day Take an antihistamine like Benadryl for widespread rashes that itch.  The adult dosage of Benadryl is 25-50 mg by mouth 4 times daily. Caution: This type of medication may cause sleepiness.  Do not drink alcohol, drive, or operate dangerous machinery while taking antihistamines.  Do not take these medications if you have prostate enlargement.  Read the package instructions thoroughly on all medications that you take.  GET HELP RIGHT AWAY IF: Symptoms that don't go away after treatment. Severe itching that persists. You develop a fever. Your skin begins to drain. You have a sore throat. You become short of breath.  MAKE SURE YOU   Understand these instructions. Will watch your condition. Will get help right away if you are not doing well or get worse.    Thank you for choosing an e-visit.  Your e-visit answers were reviewed by a board certified advanced clinical practitioner to complete your personal care plan. Depending upon the condition, your plan could have included both over the counter or prescription medications.  Please review your pharmacy choice. Make sure the pharmacy is open so you can pick up prescription now. If there is a problem, you may contact your provider through [employer] and have the prescription routed to another pharmacy.  Your safety is important to [place]. If you have drug allergies check your prescription carefully.   For the next 24 hours you can use MyChart to ask questions about today's visit, request a non-urgent call back, or ask for a work or school excuse. You will get an  email in the next two days asking about your experience. I hope that your e-visit has been valuable and will speed your recovery.   I spent approximately 5 minutes reviewing the patient's history, current symptoms and coordinating their care today.

## 2023-11-19 ENCOUNTER — Other Ambulatory Visit: Payer: Self-pay | Admitting: Family Medicine

## 2023-11-19 DIAGNOSIS — Z1231 Encounter for screening mammogram for malignant neoplasm of breast: Secondary | ICD-10-CM

## 2023-12-23 ENCOUNTER — Ambulatory Visit
Admission: RE | Admit: 2023-12-23 | Discharge: 2023-12-23 | Disposition: A | Discharge status: Home / Self Care | Payer: Managed Care, Other (non HMO) | Source: Ambulatory Visit | Attending: Family Medicine | Admitting: Family Medicine

## 2023-12-23 DIAGNOSIS — Z1231 Encounter for screening mammogram for malignant neoplasm of breast: Secondary | ICD-10-CM | POA: Insufficient documentation

## 2024-03-01 ENCOUNTER — Other Ambulatory Visit: Payer: Self-pay

## 2024-03-01 ENCOUNTER — Emergency Department

## 2024-03-01 ENCOUNTER — Emergency Department
Admission: EM | Admit: 2024-03-01 | Discharge: 2024-03-01 | Disposition: A | Discharge status: Home / Self Care | Attending: Emergency Medicine | Admitting: Emergency Medicine

## 2024-03-01 DIAGNOSIS — R42 Dizziness and giddiness: Secondary | ICD-10-CM | POA: Diagnosis present

## 2024-03-01 MED ORDER — ONDANSETRON 4 MG PO TBDP
4.0000 mg | ORAL_TABLET | Freq: Once | ORAL | Status: AC
  Administered 2024-03-01: 4 mg via ORAL
  Filled 2024-03-01: qty 1

## 2024-03-01 MED ORDER — DIAZEPAM 2 MG PO TABS
2.0000 mg | ORAL_TABLET | Freq: Once | ORAL | Status: AC
  Administered 2024-03-01: 2 mg via ORAL
  Filled 2024-03-01: qty 1

## 2024-03-01 MED ORDER — LEVOCETIRIZINE DIHYDROCHLORIDE 5 MG PO TABS: 5.0000 mg | ORAL_TABLET | Freq: Every evening | ORAL | 0 refills | Status: AC

## 2024-03-01 MED ORDER — DIAZEPAM 2 MG PO TABS: 2.0000 mg | ORAL_TABLET | Freq: Four times a day (QID) | ORAL | 0 refills | Status: AC | PRN

## 2024-03-01 MED ORDER — TRIAMCINOLONE ACETONIDE 55 MCG/ACT NA AERO: 1.0000 | INHALATION_SPRAY | Freq: Two times a day (BID) | NASAL | 0 refills | Status: AC

## 2024-03-01 NOTE — ED Notes (Signed)
 See triage note  Presents with dizziness  States this started 4 days ago Hx of vertigo but antivert is not working States she is having some nausea  Denies any fever or vomiting

## 2024-03-01 NOTE — ED Notes (Signed)
 States she is still dizzy   but nausea is better

## 2024-03-01 NOTE — ED Provider Notes (Signed)
-----------------------------------------   4:11 PM on 03/01/2024 -----------------------------------------  Blood pressure (!) 141/110, pulse 84, temperature 98 F (36.7 C), temperature source Oral, resp. rate 18, height 5\' 4"  (1.626 m), weight 90.7 kg, SpO2 96%.  Assuming care from Bridget Hartshorn, PA-C.  In short, Felicia Ellis is a 53 y.o. female with a chief complaint of Dizziness .  Refer to the original H&P for additional details.  The current plan of care is to await results of CT scan.  Patient has had some increased vertigo symptoms.  She states that she has been using her meclizine without significant relief.  There is been no reported head trauma, unilateral weakness, vision changes, slurred speech.  Patient did have findings consistent with eustachian tube dysfunction bilaterally with bulging of the TMs without injection or erythema of the TM itself.  Suspect that this is a component of allergic rhinitis which she does have seasonal allergies as well as her vertigo.  She is not taking any medications for rhinitis and we will place the patient on Xyzal and Nasacort.  Patient tolerated and did well with the Valium for her vertigo.  Patient is encouraged to use Xyzal, Nasacort and meclizine and only use the Valium if she is having increased vertigo symptoms.  Cautions on the Valium are discussed to include drowsiness, sedation and she cannot work, drive, make legal decisions or operate dangerous machinery within 6 hours of taking the Valium.  Concerning signs and symptoms to seek further care discussed.  Follow-up primary care as needed.    ED diagnosis:  Vertigo.         Racheal Patches, PA-C 03/01/24 1621    Janith Lima, MD 03/01/24 Ebony Cargo

## 2024-03-01 NOTE — ED Triage Notes (Signed)
 Pt sts that she has a hx of vertigo and it not able to get it to stop for four days. Pt sts that she has been taking her medication without any help.

## 2024-03-01 NOTE — ED Notes (Signed)
 Assumed patient care and received report from the previous nurse.

## 2024-03-01 NOTE — ED Provider Notes (Signed)
 [facility] Provider Note    Event Date/Time   First MD Initiated Contact with Patient 03/01/24 1332     (approximate)   History   Dizziness   HPI  TEEGHAN Ellis is a 53 y.o. female   presents to the ED with complaint of vertigo uncontrolled blood meclizine over the last 4 days.  Patient states she has a history of vertigo and has taken as much as 50 mg at 1 time still with no improvement.  She states it has been greater than 10 years since she had a CT scan of her head as part of her workup at that time.  Patient has a history of migraines, anemia, seasonal allergies and anemia.      Physical Exam   Triage Vital Signs: ED Triage Vitals  Encounter Vitals Group     BP 03/01/24 1245 (!) 141/110     Systolic BP Percentile --      Diastolic BP Percentile --      Pulse Rate 03/01/24 1245 84     Resp 03/01/24 1245 18     Temp 03/01/24 1245 98 F (36.7 C)     Temp Source 03/01/24 1245 Oral     SpO2 03/01/24 1245 96 %     Weight 03/01/24 1246 200 lb (90.7 kg)     Height 03/01/24 1246 5\' 4"  (1.626 m)     Head Circumference --      Peak Flow --      Pain Score 03/01/24 1246 0     Pain Loc --      Pain Education --      Exclude from Growth Chart --     Most recent vital signs: Vitals:   03/01/24 1245  BP: (!) 141/110  Pulse: 84  Resp: 18  Temp: 98 F (36.7 C)  SpO2: 96%     General: Awake, no distress.  Patient is laying on left side with an emesis bag due to nausea. CV:  Good peripheral perfusion.  Heart rate and rate rhythm. Resp:  Normal effort.  Lungs clear bilaterally. Abd:  No distention.  Other:  Speech is normal.  Conjunctiva clear.   ED Results / Procedures / Treatments   Labs (all labs ordered are listed, but only abnormal results are displayed) Labs Reviewed - No data to display    RADIOLOGY CT head without contrast pending.    PROCEDURES:  Critical Care performed:   Procedures   MEDICATIONS ORDERED IN  ED: Medications  ondansetron (ZOFRAN-ODT) disintegrating tablet 4 mg (4 mg Oral Given 03/01/24 1356)  diazepam (VALIUM) tablet 2 mg (2 mg Oral Given 03/01/24 1356)     IMPRESSION / MDM / ASSESSMENT AND PLAN / ED COURSE  I reviewed the triage vital signs and the nursing notes.   Differential diagnosis includes, but is not limited to, vertigo not responding to regular medication, CVA considered, space-occupying lesion considered, vomiting secondary to vertigo.  ----------------------------------------- 3:00 PM on 03/01/2024 ----------------------------------------- At this time Gala Romney PA-C, is assuming care of this patient.  He is aware that we are waiting on results.      Patient's presentation is most consistent with exacerbation of chronic illness.  FINAL CLINICAL IMPRESSION(S) / ED DIAGNOSES   Final diagnoses:  Vertigo     Rx / DC Orders   ED Discharge Orders     None        Note:  This document was prepared using Dragon voice recognition  software and may include unintentional dictation errors.   Tommi Rumps, PA-C 03/01/24 1507    Janith Lima, MD 03/01/24 Mikle Bosworth

## 2024-03-12 ENCOUNTER — Encounter: Payer: Managed Care, Other (non HMO) | Admitting: Dermatology

## 2024-05-26 ENCOUNTER — Ambulatory Visit: Payer: Managed Care, Other (non HMO) | Admitting: Dermatology

## 2024-05-26 DIAGNOSIS — R202 Paresthesia of skin: Secondary | ICD-10-CM

## 2024-05-26 DIAGNOSIS — L814 Other melanin hyperpigmentation: Secondary | ICD-10-CM

## 2024-05-26 DIAGNOSIS — B079 Viral wart, unspecified: Secondary | ICD-10-CM | POA: Diagnosis not present

## 2024-05-26 DIAGNOSIS — L821 Other seborrheic keratosis: Secondary | ICD-10-CM

## 2024-05-26 DIAGNOSIS — D229 Melanocytic nevi, unspecified: Secondary | ICD-10-CM

## 2024-05-26 DIAGNOSIS — L578 Other skin changes due to chronic exposure to nonionizing radiation: Secondary | ICD-10-CM

## 2024-05-26 DIAGNOSIS — Z1283 Encounter for screening for malignant neoplasm of skin: Secondary | ICD-10-CM

## 2024-05-26 DIAGNOSIS — D1801 Hemangioma of skin and subcutaneous tissue: Secondary | ICD-10-CM

## 2024-05-26 DIAGNOSIS — L820 Inflamed seborrheic keratosis: Secondary | ICD-10-CM

## 2024-05-26 DIAGNOSIS — W908XXA Exposure to other nonionizing radiation, initial encounter: Secondary | ICD-10-CM

## 2024-05-26 DIAGNOSIS — L738 Other specified follicular disorders: Secondary | ICD-10-CM

## 2024-05-26 DIAGNOSIS — L2089 Other atopic dermatitis: Secondary | ICD-10-CM

## 2024-05-26 DIAGNOSIS — L813 Cafe au lait spots: Secondary | ICD-10-CM

## 2024-05-26 MED ORDER — MOMETASONE FUROATE 0.1 % EX CREA: TOPICAL_CREAM | CUTANEOUS | 2 refills | Status: AC

## 2024-05-26 MED ORDER — PIMECROLIMUS 1 % EX CREA: TOPICAL_CREAM | CUTANEOUS | 2 refills | Status: AC

## 2024-05-26 MED ORDER — MOMETASONE FUROATE 0.1 % EX SOLN: Freq: Every day | CUTANEOUS | 0 refills | Status: AC

## 2024-05-26 NOTE — Progress Notes (Signed)
 Follow-Up Visit   Subjective  Felicia Ellis is a 53 y.o. female who presents for the following: Skin Cancer Screening and Full Body Skin Exam. Patient does have small place at L middle finger, place behind L ear, place at back, and place at L elbow that has been itchy and scab has come off. Patient also reports itching at scalp.   The patient presents for Total-Body Skin Exam (TBSE) for skin cancer screening and mole check. The patient has spots, moles and lesions to be evaluated, some may be new or changing and the patient may have concern these could be cancer.   The following portions of the chart were reviewed this encounter and updated as appropriate: medications, allergies, medical history  Review of Systems:  No other skin or systemic complaints except as noted in HPI or Assessment and Plan.  Objective  Well appearing patient in no apparent distress; mood and affect are within normal limits.  A full examination was performed including scalp, head, eyes, ears, nose, lips, neck, chest, axillae, abdomen, back, buttocks, bilateral upper extremities, bilateral lower extremities, hands, feet, fingers, toes, fingernails, and toenails. All findings within normal limits unless otherwise noted below.   Relevant physical exam findings are noted in the Assessment and Plan.  L 3rd finger x2, L 2nd MCP x1, L upper arm x1, R index x1, L medial foot x1 (6) tiny keratotic papules--Discussed viral etiology and contagion. L postauricular neck x1 Stuck on waxy papule with erythema Mid Back L spinal mid back clear today  Assessment & Plan   SKIN CANCER SCREENING PERFORMED TODAY.  ACTINIC DAMAGE - Chronic condition, secondary to cumulative UV/sun exposure - diffuse scaly erythematous macules with underlying dyspigmentation - Recommend daily broad spectrum sunscreen SPF 30+ to sun-exposed areas, reapply every 2 hours as needed.  - Staying in the shade or wearing long sleeves, sun glasses  (UVA+UVB protection) and wide brim hats (4-inch brim around the entire circumference of the hat) are also recommended for sun protection.  - Call for new or changing lesions.  LENTIGINES, SEBORRHEIC KERATOSES, HEMANGIOMAS - Benign normal skin lesions - Benign-appearing - Call for any changes  SEBORRHEIC KERATOSIS - 1.5 cm waxy tan patch at R temple - Benign-appearing - Discussed benign etiology and prognosis. - Observe - Call for any changes  MELANOCYTIC NEVI - Tan-brown and/or pink-flesh-colored symmetric macules and papules - 3 x 2 medium brown macule at mid lower back - Benign appearing on exam today - Observation - Call clinic for new or changing moles - Recommend daily use of broad spectrum spf 30+ sunscreen to sun-exposed areas.   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 Call for changes.   ATOPIC DERMATITIS Exam: Mild erythema with scale and excoriations at R and L antecubetum, erythema with mild scale at R ear canal. Pink scaly papules at R postauricular scalp. Lichenified patch at L lateral elbow. 3% BSA  Chronic and persistent condition with duration or expected duration over one year. Condition is bothersome/symptomatic for patient. Currently flared.   Atopic dermatitis (eczema) is a chronic, relapsing, pruritic condition that can significantly affect quality of life. It is often associated with allergic rhinitis and/or asthma and can require treatment with topical medications, phototherapy, or in severe cases biologic injectable medication (Dupixent; Adbry) or Oral JAK inhibitors.  Treatment Plan: Start mometasone cream apply 1-2 times daily to aa for flares until itchy rash cleared. Avoid applying to face, groin, and axilla. Use as directed. Long-term use can cause thinning of the skin.  Start  mometasone lotion (solution) apply drops to affected areas, scalp, ears every day/bid prn flares.   Topical steroids (such as triamcinolone , fluocinolone, fluocinonide, mometasone, clobetasol, halobetasol, betamethasone, hydrocortisone) can cause thinning and lightening of the skin if they are used for too long in the same area. Your physician has selected the right strength medicine for your problem and area affected on the body. Please use your medication only as directed by your physician to prevent side effects.   Start Pimecrolimus apply 1-2 times a day to aas prevent from flaring. May use for longer period of time until rash cleared, does not thin the skin.   Recommend gentle skin care.  VIRAL WARTS, UNSPECIFIED TYPE (6) L 3rd finger x2, L 2nd MCP x1, L upper arm x1, R index x1, L medial foot x1 (6) Viral Wart (HPV) Counseling  Discussed viral / HPV (Human Papilloma Virus) etiology and risk of spread /infectivity to other areas of body as well as to other people.  Multiple treatments and methods may be required to clear warts and it is possible treatment may not be successful.  Treatment risks include discoloration; scarring and there is still potential for wart recurrence. Destruction of lesion - L 3rd finger x2, L 2nd MCP x1, L upper arm x1, R index x1, L medial foot x1 (6)  Destruction method: cryotherapy   Informed consent: discussed and consent obtained   Lesion destroyed using liquid nitrogen: Yes   Region frozen until ice ball extended beyond lesion: Yes   Outcome: patient tolerated procedure well with no complications   Post-procedure details: wound care instructions given   Additional details:  Prior to procedure, discussed risks of blister formation, small wound, skin dyspigmentation, or rare scar following cryotherapy. Recommend Vaseline ointment to treated areas while healing.  INFLAMED SEBORRHEIC KERATOSIS L postauricular neck x1 Vs. Irritated Skin Tag  Symptomatic,  irritating, patient would like treated. Destruction of lesion - L postauricular neck x1  Destruction method: cryotherapy   Informed consent: discussed and consent obtained   Lesion destroyed using liquid nitrogen: Yes   Region frozen until ice ball extended beyond lesion: Yes   Outcome: patient tolerated procedure well with no complications   Post-procedure details: wound care instructions given   Additional details:  Prior to procedure, discussed risks of blister formation, small wound, skin dyspigmentation, or rare scar following cryotherapy. Recommend Vaseline ointment to treated areas while healing.  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 Condition is symptomatic/ bothersome to patient. Not currently at goal.   Chronic condition without cure secondary to pinched nerve along spine causing itching or sensation changes in an area of skin. Chronic rubbing or scratching causes darkening of the skin.  OTC treatments which can help with itch include numbing creams like pramoxine or lidocaine  which temporarily reduce itch or Capsaicin-containing creams which cause a burning sensation but which sometimes over time will reset the nerves to stop producing itch.  If you choose to use Capsaicin cream, it is recommended to use it 5 times daily for 1 week followed by 3 times daily for 3-6 weeks. You may have to continue using it long-term.  If not doing well with OTC options, could consider [facility] compounded prescription anti-itch cream  with Amitriptyline 5% / Lidocaine  5% / Pramoxine 1% or Amitriptyline 5% / Gabapentin 10% / Lidocaine  5% Cream or other prescription cream or pill options.    Recommend Gold Bond Rapid Relief Anti-Itch cream up to 3 times per day to areas that are itchy. Return in 1 year (on 05/26/2025) for w/ Dr. Jackquline, Atopic Dermatitis, TBSE.  I, Jacquelynn V. Wilfred, CMA, am acting as scribe for Rexene Jackquline, MD .   Documentation: I have reviewed the above documentation for accuracy and completeness, and I agree with the above.  Rexene Jackquline, MD

## 2024-05-26 NOTE — Patient Instructions (Addendum)
 For eczema: Start Mometasone cream apply 1-2 times daily to aa for flares. Avoid applying to face, groin, and axilla. Use as directed. Long-term use can cause thinning of the skin.  Start mometasone lotion (solution) apply drops to affected areas, scalp, ears prn flares. Use only a few days at a time.  Topical steroids (such as triamcinolone , fluocinolone, fluocinonide, mometasone, clobetasol, halobetasol, betamethasone, hydrocortisone) can cause thinning and lightening of the skin if they are used for too long in the same area. Your physician has selected the right strength medicine for your problem and area affected on the body. Please use your medication only as directed by your physician to prevent side effects.    Start Pimecrolimus (elidel) apply 1-2 times a day to prevent from flaring. May use regularly, does not thin the skin.     Cryotherapy Aftercare  Wash gently with soap and water everyday.   Apply Vaseline and Band-Aid daily until healed.     Gentle Skin Care Guide  1. Bathe no more than once a day.  2. Avoid bathing in hot water  3. Use a mild soap like Dove, Vanicream, Cetaphil, CeraVe. Can use Lever 2000 or Cetaphil antibacterial soap  4. Use soap only where you need it. On most days, use it under your arms, between your legs, and on your feet. Let the water rinse other areas unless visibly dirty.  5. When you get out of the bath/shower, use a towel to gently blot your skin dry, don't rub it.  6. While your skin is still a little damp, apply a moisturizing cream such as Vanicream, CeraVe, Cetaphil, Eucerin, Sarna lotion or plain Vaseline Jelly. For hands apply Neutrogena [place] Hand Cream or Excipial Hand Cream.  7. Reapply moisturizer any time you start to itch or feel dry.  8. Sometimes using free and clear laundry detergents can be helpful. Fabric softener sheets should be avoided. Downy Free & Gentle liquid, or any liquid fabric softener that is free of dyes  and perfumes, it acceptable to use  9. If your doctor has given you prescription creams you may apply moisturizers over them      Recommend daily broad spectrum sunscreen SPF 30+ to sun-exposed areas, reapply every 2 hours as needed. Call for new or changing lesions.  Staying in the shade or wearing long sleeves, sun glasses (UVA+UVB protection) and wide brim hats (4-inch brim around the entire circumference of the hat) are also recommended for sun protection.    Melanoma ABCDEs  Melanoma is the most dangerous type of skin cancer, and is the leading cause of death from skin disease.  You are more likely to develop melanoma if you: Have light-colored skin, light-colored eyes, or red or blond hair Spend a lot of time in the sun Tan regularly, either outdoors or in a tanning bed Have had blistering sunburns, especially during childhood Have a close family member who has had a melanoma Have atypical moles or large birthmarks  Early detection of melanoma is key since treatment is typically straightforward and cure rates are extremely high if we catch it early.   The first sign of melanoma is often a change in a mole or a new dark spot.  The ABCDE system is a way of remembering the signs of melanoma.  A for asymmetry:  The two halves do not match. B for border:  The edges of the growth are irregular. C for color:  A mixture of colors are present instead of an even  brown color. D for diameter:  Melanomas are usually (but not always) greater than 6mm - the size of a pencil eraser. E for evolution:  The spot keeps changing in size, shape, and color.  Please check your skin once per month between visits. You can use a small mirror in front and a large mirror behind you to keep an eye on the back side or your body.   If you see any new or changing lesions before your next follow-up, please call to schedule a visit.  Please continue daily skin protection including broad spectrum sunscreen SPF  30+ to sun-exposed areas, reapplying every 2 hours as needed when you're outdoors.   Staying in the shade or wearing long sleeves, sun glasses (UVA+UVB protection) and wide brim hats (4-inch brim around the entire circumference of the hat) are also recommended for sun protection.    Due to recent changes in healthcare laws, you may see results of your pathology and/or laboratory studies on MyChart before the doctors have had a chance to review them. We understand that in some cases there may be results that are confusing or concerning to you. Please understand that not all results are received at the same time and often the doctors may need to interpret multiple results in order to provide you with the best plan of care or course of treatment. Therefore, we ask that you please give us  2 business days to thoroughly review all your results before contacting the office for clarification. Should we see a critical lab result, you will be contacted sooner.   If You Need Anything After Your Visit  If you have any questions or concerns for your doctor, please call our main line at [phone] and press option 4 to reach your doctor's medical assistant. If no one answers, please leave a voicemail as directed and we will return your call as soon as possible. Messages left after 4 pm will be answered the following business day.   You may also send us  a message via MyChart. We typically respond to MyChart messages within 1-2 business days.  For prescription refills, please ask your pharmacy to contact our office. Our fax number is [phone].  If you have an urgent issue when the clinic is closed that cannot wait until the next business day, you can page your doctor at the number below.    Please note that while we do our best to be available for urgent issues outside of office hours, we are not available 24/7.   If you have an urgent issue and are unable to reach us , you may choose to seek medical care at  your doctor's office, retail clinic, urgent care center, or emergency room.  If you have a medical emergency, please immediately call 911 or go to the emergency department.  Pager Numbers  - Dr. Hester: [phone]  - Dr. Jackquline: [phone]  - Dr. Claudene: [phone]   In the event of inclement weather, please call our main line at [phone] for an update on the status of any delays or closures.  Dermatology Medication Tips: Please keep the boxes that topical medications come in in order to help keep track of the instructions about where and how to use these. Pharmacies typically print the medication instructions only on the boxes and not directly on the medication tubes.   If your medication is too expensive, please contact our office at [phone] option 4 or send us  a message through MyChart.   We are unable  to tell what your co-pay for medications will be in advance as this is different depending on your insurance coverage. However, we may be able to find a substitute medication at lower cost or fill out paperwork to get insurance to cover a needed medication.   If a prior authorization is required to get your medication covered by your insurance company, please allow us  1-2 business days to complete this process.  Drug prices often vary depending on where the prescription is filled and some pharmacies may offer cheaper prices.  The website www.goodrx.com contains coupons for medications through different pharmacies. The prices here do not account for what the cost may be with help from insurance (it may be cheaper with your insurance), but the website can give you the price if you did not use any insurance.  - You can print the associated coupon and take it with your prescription to the pharmacy.  - You may also stop by our office during regular business hours and pick up a GoodRx coupon card.  - If you need your prescription sent electronically to a different pharmacy,  notify our office through [facility] or by phone at [phone] option 4.     Si Usted Necesita Algo Despus de Su Visita  Tambin puede enviarnos un mensaje a travs de Clinical cytogeneticist. Por lo general respondemos a los mensajes de MyChart en el transcurso de 1 a 2 das hbiles.  Para renovar recetas, por favor pida a su farmacia que se ponga en contacto con nuestra oficina. Randi lakes de fax es [place] [phone].  Si tiene un asunto urgente cuando la clnica est cerrada y que no puede esperar hasta el siguiente da hbil, puede llamar/localizar a su doctor(a) al nmero que aparece a continuacin.   Por favor, tenga en cuenta que aunque hacemos todo lo posible para estar disponibles para asuntos urgentes fuera del horario de [place], no estamos disponibles las 24 horas del da, los 7 [address]  [address] de la [place].   Si tiene un problema urgente y no puede comunicarse con nosotros, puede optar por buscar atencin mdica  en el consultorio de su doctor(a), en una clnica privada, en un centro de atencin urgente o en una sala de emergencias.  Si tiene Engineer, drilling, por favor llame inmediatamente al 911 o vaya a la sala de emergencias.  Nmeros de bper  - Dr. Hester: [phone]  - Dra. Jackquline: [phone]  - Dr. Claudene: [phone]   En caso de inclemencias del tiempo, por favor llame a landry capes principal al [phone] para una actualizacin sobre el [place] de cualquier retraso o cierre.  Consejos para la medicacin en dermatologa: Por favor, guarde las cajas en las que vienen los medicamentos de uso tpico para ayudarle a seguir las instrucciones sobre dnde y cmo usarlos. Las farmacias generalmente imprimen las instrucciones del medicamento slo en las cajas y no directamente en los tubos del [place].   Si su medicamento es muy caro, por favor, pngase en contacto con landry rieger llamando al [phone] y presione la opcin 4 o envenos un mensaje a travs de  Clinical cytogeneticist.   No podemos decirle cul ser su copago por los medicamentos por adelantado ya que esto es diferente dependiendo de la cobertura de su seguro. Sin embargo, es posible que podamos encontrar un medicamento sustituto a Audiological scientist un formulario para que el seguro cubra el medicamento que se considera necesario.   Si se requiere una autorizacin previa para que su compaa de  seguros cubra su medicamento, por favor permtanos de 1 a 2 das hbiles para completar [address]  Los precios de los medicamentos varan con frecuencia dependiendo del Environmental consultant de dnde se surte la receta y alguna farmacias pueden ofrecer precios ms baratos.  El sitio web www.goodrx.com tiene cupones para medicamentos de Health and safety inspector. Los precios aqu no tienen en cuenta lo que podra costar con la ayuda del seguro (puede ser ms barato con su seguro), pero el sitio web puede darle el precio si no utiliz Tourist information centre manager.  - Puede imprimir el cupn correspondiente y llevarlo con su receta a la farmacia.  - Tambin puede pasar por nuestra oficina durante el horario de atencin regular y Education officer, museum una tarjeta de cupones de GoodRx.  - Si necesita que su receta se enve electrnicamente a una farmacia diferente, informe a nuestra oficina a travs de MyChart de  o por telfono llamando al [phone] y presione la opcin 4.

## 2024-12-11 ENCOUNTER — Other Ambulatory Visit: Payer: Self-pay | Admitting: Family Medicine

## 2024-12-11 DIAGNOSIS — Z1231 Encounter for screening mammogram for malignant neoplasm of breast: Secondary | ICD-10-CM

## 2024-12-30 ENCOUNTER — Ambulatory Visit
Admission: RE | Admit: 2024-12-30 | Discharge: 2024-12-30 | Disposition: A | Discharge status: Home / Self Care | Source: Ambulatory Visit | Attending: Family Medicine | Admitting: Family Medicine

## 2024-12-30 DIAGNOSIS — Z1231 Encounter for screening mammogram for malignant neoplasm of breast: Secondary | ICD-10-CM | POA: Insufficient documentation

## 2025-06-08 ENCOUNTER — Encounter: Admitting: Dermatology
